# Patient Record
Sex: Female | Born: 1970 | Race: White | Hispanic: No | Marital: Married | Smoking: Former smoker
Health system: Southern US, Community
[De-identification: ages and names within clinical notes are randomized; demographics above are authoritative.]

## PROBLEM LIST (undated history)

## (undated) DIAGNOSIS — R51 Headache: Secondary | ICD-10-CM

## (undated) DIAGNOSIS — D1720 Benign lipomatous neoplasm of skin and subcutaneous tissue of unspecified limb: Secondary | ICD-10-CM

## (undated) DIAGNOSIS — R05 Cough: Secondary | ICD-10-CM

## (undated) HISTORY — PX: NO PAST SURGERIES: SHX2092

---

## 2012-08-23 ENCOUNTER — Encounter: Payer: Self-pay | Admitting: Family Medicine

## 2012-09-06 ENCOUNTER — Other Ambulatory Visit: Payer: Self-pay | Admitting: Family Medicine

## 2012-11-07 ENCOUNTER — Encounter: Payer: Self-pay | Admitting: Family Medicine

## 2012-11-29 ENCOUNTER — Ambulatory Visit (INDEPENDENT_AMBULATORY_CARE_PROVIDER_SITE_OTHER): Payer: 59 | Admitting: Family Medicine

## 2012-11-29 ENCOUNTER — Encounter: Payer: Self-pay | Admitting: Family Medicine

## 2012-11-29 VITALS — BP 100/60 | HR 78 | Temp 98.3°F | Resp 16 | Ht 59.0 in | Wt 135.0 lb

## 2012-11-29 DIAGNOSIS — Z0000 Encounter for general adult medical examination without abnormal findings: Secondary | ICD-10-CM

## 2012-11-29 DIAGNOSIS — Z87891 Personal history of nicotine dependence: Secondary | ICD-10-CM | POA: Insufficient documentation

## 2012-11-29 NOTE — Progress Notes (Signed)
  Subjective:    Patient ID: Jeanette Herring, female    DOB: 09-04-1970, 42 y.o.   MRN: [id number]  HPI Patient is here for complete physical exam. She has no medical concerns.  She has no significant past medical history. Past Medical History  Diagnosis Date  . Former smoker    No current outpatient prescriptions on file prior to visit.   No current facility-administered medications on file prior to visit.   No Known Allergies History   Social History  . Marital Status: Married    Spouse Name: N/A    Number of Children: N/A  . Years of Education: N/A   Occupational History  . Not on file.   Social History Main Topics  . Smoking status: Former Games developer  . Smokeless tobacco: Never Used  . Alcohol Use: Yes     Comment: ocasionally  . Drug Use: No  . Sexually Active: Yes     Comment: Married with no kids, 2 step kids, works at [employer]   Other Topics Concern  . Not on file   Social History Narrative  . No narrative on file   No family history on file. Mother, father, sister, 2 step sisters are alive and well. She has a maternal grandfather with colon cancer and maternal grandmother with a CVA.   Review of Systems  All other systems reviewed and are negative.       Objective:   Physical Exam  Vitals reviewed. Constitutional: She is oriented to person, place, and time. She appears well-developed and well-nourished. No distress.  HENT:  Head: Normocephalic and atraumatic.  Right Ear: External ear normal.  Left Ear: External ear normal.  Nose: Nose normal.  Mouth/Throat: Oropharynx is clear and moist. No oropharyngeal exudate.  Eyes: Conjunctivae and EOM are normal. Pupils are equal, round, and reactive to light. Right eye exhibits no discharge. Left eye exhibits no discharge. No scleral icterus.  Neck: Normal range of motion. Neck supple. No JVD present. No tracheal deviation present. No thyromegaly present.  Cardiovascular: Normal rate, regular rhythm, normal  heart sounds and intact distal pulses.  Exam reveals no gallop and no friction rub.   No murmur heard. Pulmonary/Chest: Effort normal and breath sounds normal. No stridor. No respiratory distress. She has no wheezes. She has no rales. She exhibits no tenderness.  Abdominal: Soft. Bowel sounds are normal. She exhibits no distension and no mass. There is no tenderness. There is no rebound and no guarding.  Genitourinary: Vagina normal and uterus normal. No vaginal discharge found.  Musculoskeletal: Normal range of motion. She exhibits no edema and no tenderness.  Lymphadenopathy:    She has no cervical adenopathy.  Neurological: She is alert and oriented to person, place, and time. She has normal reflexes. She displays normal reflexes. No cranial nerve deficit. She exhibits normal muscle tone. Coordination normal.  Skin: Skin is warm. No rash noted. She is not diaphoretic. No erythema. No pallor.  Psychiatric: She has a normal mood and affect. Her behavior is normal. Judgment and thought content normal.   breast exam is normal. No cervical motion tenderness. No adnexal tenderness.        Assessment & Plan:  1. Routine general medical examination at a health care facility Pap smear sent.  Recommended mammogram next year. Return fasting for CBC, CMP, fasting lipid panel, and TSH. Recommended 1000 mg of calcium per day and 800 international units of vitamin D per day. - Pap IG (Image Guided) Loney Loh

## 2012-12-02 LAB — PAP IG (IMAGE GUIDED)

## 2012-12-09 ENCOUNTER — Ambulatory Visit (INDEPENDENT_AMBULATORY_CARE_PROVIDER_SITE_OTHER): Payer: 59 | Admitting: General Surgery

## 2012-12-09 ENCOUNTER — Encounter (INDEPENDENT_AMBULATORY_CARE_PROVIDER_SITE_OTHER): Payer: Self-pay | Admitting: General Surgery

## 2012-12-09 VITALS — BP 116/65 | HR 64 | Temp 98.6°F | Resp 14 | Ht 59.0 in | Wt 133.2 lb

## 2012-12-09 DIAGNOSIS — R2230 Localized swelling, mass and lump, unspecified upper limb: Secondary | ICD-10-CM

## 2012-12-09 DIAGNOSIS — M259 Joint disorder, unspecified: Secondary | ICD-10-CM

## 2012-12-09 NOTE — Progress Notes (Signed)
Patient ID: Jeanette Herring, female   DOB: October 09, 1970, 42 y.o.   MRN: [id number]  Chief Complaint  Patient presents with  . Mass    HPI Jeanette Herring is a 42 y.o. female. Referred by Dr Yetta Barre HPI [phone] otherwise healthy who presents with a couple year history of enlarging right shoulder mass that is now tender at times. This has no history of infection or drainage.  She comes in today to discuss possible excision  Past Medical History  Diagnosis Date  . Former smoker     History reviewed. No pertinent past surgical history.  History reviewed. No pertinent family history.  Social History History  Substance Use Topics  . Smoking status: Former Games developer  . Smokeless tobacco: Never Used  . Alcohol Use: Yes     Comment: ocasionally    No Known Allergies  No current outpatient prescriptions on file.   No current facility-administered medications for this visit.    Review of Systems Review of Systems  Constitutional: Negative for fever, chills and unexpected weight change.  HENT: Positive for congestion. Negative for hearing loss, sore throat, trouble swallowing and voice change.   Eyes: Negative for visual disturbance.  Respiratory: Negative for cough and wheezing.   Cardiovascular: Negative for chest pain, palpitations and leg swelling.  Gastrointestinal: Negative for nausea, vomiting, abdominal pain, diarrhea, constipation, blood in stool, abdominal distention and anal bleeding.  Genitourinary: Negative for hematuria, vaginal bleeding and difficulty urinating.  Musculoskeletal: Negative for arthralgias.  Skin: Negative for rash and wound.  Neurological: Negative for seizures, syncope and headaches.  Hematological: Negative for adenopathy. Does not bruise/bleed easily.  Psychiatric/Behavioral: Negative for confusion.    Blood pressure 116/65, pulse 64, temperature 98.6 F (37 C), temperature source Temporal, resp. rate 14, height 4\' 11"  (1.499 m), weight 133 lb 3.2 oz  (60.419 kg).  Physical Exam Physical Exam  Vitals reviewed. Constitutional: She appears well-developed and well-nourished.  Cardiovascular: Normal rate, regular rhythm and normal heart sounds.   Pulmonary/Chest: Effort normal and breath sounds normal. She has no wheezes. She has no rales.  Musculoskeletal:       Arms:   Data Reviewed Notes from [facility] dermatology  Assessment    Right shoulder mass    Plan    This is likely lipoma. We discussed observation vs excision. This has enlarged and she would like it removed.  We discussed excision with possible risks including to but not limited to bleeding, infection, seroma.         Priscilla Kirstein 12/09/2012, 11:11 PM

## 2012-12-20 ENCOUNTER — Telehealth (INDEPENDENT_AMBULATORY_CARE_PROVIDER_SITE_OTHER): Payer: Self-pay | Admitting: General Surgery

## 2012-12-20 NOTE — Telephone Encounter (Signed)
Pt made aware of her financial obligation/ orders good for 90 days/ will call to schedule

## 2012-12-31 ENCOUNTER — Other Ambulatory Visit: Payer: 59

## 2012-12-31 DIAGNOSIS — Z0000 Encounter for general adult medical examination without abnormal findings: Secondary | ICD-10-CM

## 2012-12-31 LAB — CBC WITH DIFFERENTIAL/PLATELET
Eosinophils Absolute: 0.1 10*3/uL (ref 0.0–0.7)
Eosinophils Relative: 2 % (ref 0–5)
Hemoglobin: 13.3 g/dL (ref 12.0–15.0)
Lymphs Abs: 1.6 10*3/uL (ref 0.7–4.0)
MCH: 29.5 pg (ref 26.0–34.0)
MCV: 88.9 fL (ref 78.0–100.0)
Monocytes Absolute: 0.6 10*3/uL (ref 0.1–1.0)
Monocytes Relative: 8 % (ref 3–12)
Platelets: 320 10*3/uL (ref 150–400)
RBC: 4.51 MIL/uL (ref 3.87–5.11)

## 2012-12-31 LAB — COMPLETE METABOLIC PANEL WITH GFR
AST: 13 U/L (ref 0–37)
Alkaline Phosphatase: 66 U/L (ref 39–117)
BUN: 15 mg/dL (ref 6–23)
Calcium: 9.4 mg/dL (ref 8.4–10.5)
Chloride: 109 mEq/L (ref 96–112)
Creat: 0.81 mg/dL (ref 0.50–1.10)

## 2012-12-31 LAB — LIPID PANEL
Cholesterol: 165 mg/dL (ref 0–200)
Triglycerides: 63 mg/dL (ref <150)
VLDL: 13 mg/dL (ref 0–40)

## 2013-06-11 ENCOUNTER — Ambulatory Visit (INDEPENDENT_AMBULATORY_CARE_PROVIDER_SITE_OTHER): Payer: 59 | Admitting: General Surgery

## 2013-06-11 ENCOUNTER — Encounter (INDEPENDENT_AMBULATORY_CARE_PROVIDER_SITE_OTHER): Payer: Self-pay | Admitting: General Surgery

## 2013-06-11 VITALS — BP 102/62 | HR 70 | Resp 16 | Ht 59.0 in | Wt 135.0 lb

## 2013-06-11 DIAGNOSIS — D1720 Benign lipomatous neoplasm of skin and subcutaneous tissue of unspecified limb: Secondary | ICD-10-CM

## 2013-06-11 DIAGNOSIS — D1739 Benign lipomatous neoplasm of skin and subcutaneous tissue of other sites: Secondary | ICD-10-CM

## 2013-06-11 NOTE — Progress Notes (Signed)
Patient ID: Jeanette Herring, female   DOB: 21-May-1970, 43 y.o.   MRN: [id number]  Chief Complaint  Patient presents with  . Other    Eval lipoma rt shoulder    HPI Jeanette Herring is a 43 y.o. female.   HPI 37 yof otherwise healthy who presents with a couple year history of enlarging right shoulder mass that is now tender at times. This has no history of infection or drainage. I saw her at the end of last year she was unable to do this at that point. This is still getting a little bit larger at this time but has no other complaints as I last saw her.  Past Medical History  Diagnosis Date  . Former smoker     History reviewed. No pertinent past surgical history.  History reviewed. No pertinent family history.  Social History History  Substance Use Topics  . Smoking status: Former Research scientist (life sciences)  . Smokeless tobacco: Never Used  . Alcohol Use: Yes     Comment: ocasionally    No Known Allergies  No current outpatient prescriptions on file.   No current facility-administered medications for this visit.    Review of Systems Review of Systems  Constitutional: Negative for fever, chills and unexpected weight change.  HENT: Negative for congestion, hearing loss, sore throat, trouble swallowing and voice change.   Eyes: Negative for visual disturbance.  Respiratory: Negative for cough and wheezing.   Cardiovascular: Negative for chest pain, palpitations and leg swelling.  Gastrointestinal: Negative for nausea, vomiting, abdominal pain, diarrhea, constipation, blood in stool, abdominal distention and anal bleeding.  Genitourinary: Negative for hematuria, vaginal bleeding and difficulty urinating.  Musculoskeletal: Negative for arthralgias.  Skin: Negative for rash and wound.  Neurological: Negative for seizures, syncope and headaches.  Hematological: Negative for adenopathy. Does not bruise/bleed easily.  Psychiatric/Behavioral: Negative for confusion.    Blood pressure 102/62, pulse  70, resp. rate 16, height 4\' 11"  (1.499 m), weight 135 lb (61.236 kg).  Physical Exam Physical Exam  Vitals reviewed. Constitutional: She appears well-developed.  Cardiovascular: Normal rate, regular rhythm and normal heart sounds.   Pulmonary/Chest: Effort normal and breath sounds normal. She has no wheezes. She has no rales.  Musculoskeletal:       Arms: Lymphadenopathy:    She has no cervical adenopathy.      Assessment    Right shoulder lipoma    Plan    She is ready to have this removed now. We discussed excision of this likely lipoma under anesthesia. We discussed the risks of hematoma, seroma, as well as a recurrence. I told her that if she goes back to work quickly then we would need to put sutures on the outside and she understands this.        Koi Yarbro 06/11/2013, 9:55 AM

## 2013-06-23 ENCOUNTER — Encounter: Payer: Self-pay | Admitting: Family Medicine

## 2013-06-23 ENCOUNTER — Ambulatory Visit (INDEPENDENT_AMBULATORY_CARE_PROVIDER_SITE_OTHER): Payer: 59 | Admitting: Family Medicine

## 2013-06-23 VITALS — BP 110/68 | HR 76 | Temp 97.4°F | Resp 12 | Ht 59.0 in | Wt 137.0 lb

## 2013-06-23 DIAGNOSIS — J019 Acute sinusitis, unspecified: Secondary | ICD-10-CM

## 2013-06-23 MED ORDER — AMOXICILLIN-POT CLAVULANATE 875-125 MG PO TABS
1.0000 | ORAL_TABLET | Freq: Two times a day (BID) | ORAL | Status: DC
Start: 2013-06-23 — End: 2013-07-14

## 2013-06-23 MED ORDER — FLUCONAZOLE 150 MG PO TABS: 150.0000 mg | ORAL_TABLET | Freq: Once | ORAL | Status: DC

## 2013-06-23 NOTE — Progress Notes (Signed)
   Subjective:    Patient ID: Jeanette Herring, female    DOB: 05-04-1970, 43 y.o.   MRN: [id number]  HPI Patient presents with 3 days of right maxillary sinus pain, left frontal sinus pain, pressure, head congestion, rhinorrhea, and nonproductive cough. She denies any fever. She denies any pain in her teeth. She denies any purulent nasal discharge. Past Medical History  Diagnosis Date  . Former smoker    No current outpatient prescriptions on file prior to visit.   No current facility-administered medications on file prior to visit.   No Known Allergies History   Social History  . Marital Status: Married    Spouse Name: N/A    Number of Children: N/A  . Years of Education: N/A   Occupational History  . Not on file.   Social History Main Topics  . Smoking status: Former Research scientist (life sciences)  . Smokeless tobacco: Never Used  . Alcohol Use: Yes     Comment: ocasionally  . Drug Use: No  . Sexual Activity: Yes     Comment: Married with no kids, 2 step kids, works at [employer].   Other Topics Concern  . Not on file   Social History Narrative  . No narrative on file      Review of Systems  All other systems reviewed and are negative.       Objective:   Physical Exam  Vitals reviewed. Constitutional: She appears well-developed and well-nourished. No distress.  HENT:  Head: Normocephalic and atraumatic.  Right Ear: Tympanic membrane, external ear and ear canal normal.  Left Ear: Tympanic membrane, external ear and ear canal normal.  Nose: Mucosal edema and rhinorrhea present. Right sinus exhibits no maxillary sinus tenderness and no frontal sinus tenderness. Left sinus exhibits no maxillary sinus tenderness and no frontal sinus tenderness.  Mouth/Throat: Oropharynx is clear and moist. No oropharyngeal exudate.  Eyes: Conjunctivae are normal.  Neck: Neck supple. No thyromegaly present.  Cardiovascular: Normal rate, regular rhythm and normal heart sounds.   No murmur  heard. Pulmonary/Chest: Effort normal and breath sounds normal. No respiratory distress. She has no wheezes. She has no rales.  Lymphadenopathy:    She has no cervical adenopathy.  Skin: She is not diaphoretic.          Assessment & Plan:  1. Acute rhinosinusitis I explained to the patient that she has a viral rhinosinusitis. I explained that 90% get better within 10 days on their own. I recommended that she use Sudafed 60 mg every 6 hours as needed for congestion and nasal saline 4 times a day. If symptoms persist greater than one week or she develops fever/severe sinus pain/dental pain, I gave her a prescription for Augmentin 875 mg by mouth twice a day for 10 days. Also gave her a prescription for Diflucan in case she gets yeast infection after she takes the antibiotic. She agrees to wait at least one week unless symptoms change - amoxicillin-clavulanate (AUGMENTIN) 875-125 MG per tablet; Take 1 tablet by mouth 2 (two) times daily.  Dispense: 20 tablet; Refill: 0 - fluconazole (DIFLUCAN) 150 MG tablet; Take 1 tablet (150 mg total) by mouth once.  Dispense: 1 tablet; Refill: 0

## 2013-06-29 DIAGNOSIS — D1720 Benign lipomatous neoplasm of skin and subcutaneous tissue of unspecified limb: Secondary | ICD-10-CM

## 2013-06-29 HISTORY — DX: Benign lipomatous neoplasm of skin and subcutaneous tissue of unspecified limb: D17.20

## 2013-07-14 ENCOUNTER — Encounter (HOSPITAL_BASED_OUTPATIENT_CLINIC_OR_DEPARTMENT_OTHER): Payer: Self-pay | Admitting: *Deleted

## 2013-07-14 DIAGNOSIS — R059 Cough, unspecified: Secondary | ICD-10-CM

## 2013-07-14 HISTORY — DX: Cough, unspecified: R05.9

## 2013-07-16 ENCOUNTER — Other Ambulatory Visit (INDEPENDENT_AMBULATORY_CARE_PROVIDER_SITE_OTHER): Payer: Self-pay | Admitting: General Surgery

## 2013-07-17 ENCOUNTER — Ambulatory Visit (HOSPITAL_BASED_OUTPATIENT_CLINIC_OR_DEPARTMENT_OTHER)
Admission: RE | Admit: 2013-07-17 | Discharge: 2013-07-17 | Disposition: A | Discharge status: Home / Self Care | Payer: 59 | Source: Ambulatory Visit | Attending: General Surgery | Admitting: General Surgery

## 2013-07-17 ENCOUNTER — Encounter (HOSPITAL_BASED_OUTPATIENT_CLINIC_OR_DEPARTMENT_OTHER): Payer: Self-pay | Admitting: *Deleted

## 2013-07-17 ENCOUNTER — Encounter (INDEPENDENT_AMBULATORY_CARE_PROVIDER_SITE_OTHER): Payer: Self-pay

## 2013-07-17 ENCOUNTER — Encounter (HOSPITAL_BASED_OUTPATIENT_CLINIC_OR_DEPARTMENT_OTHER): Payer: 59 | Admitting: Anesthesiology

## 2013-07-17 ENCOUNTER — Encounter (INDEPENDENT_AMBULATORY_CARE_PROVIDER_SITE_OTHER): Payer: Self-pay | Admitting: General Surgery

## 2013-07-17 ENCOUNTER — Encounter (HOSPITAL_BASED_OUTPATIENT_CLINIC_OR_DEPARTMENT_OTHER)
Admission: RE | Disposition: A | Discharge status: Home / Self Care | Payer: Self-pay | Source: Ambulatory Visit | Attending: General Surgery

## 2013-07-17 ENCOUNTER — Ambulatory Visit (HOSPITAL_BASED_OUTPATIENT_CLINIC_OR_DEPARTMENT_OTHER): Payer: 59 | Admitting: Anesthesiology

## 2013-07-17 DIAGNOSIS — D1739 Benign lipomatous neoplasm of skin and subcutaneous tissue of other sites: Secondary | ICD-10-CM

## 2013-07-17 DIAGNOSIS — Z87891 Personal history of nicotine dependence: Secondary | ICD-10-CM | POA: Insufficient documentation

## 2013-07-17 HISTORY — PX: LIPOMA EXCISION: SHX5283

## 2013-07-17 HISTORY — DX: Cough: R05

## 2013-07-17 HISTORY — DX: Benign lipomatous neoplasm of skin and subcutaneous tissue of unspecified limb: D17.20

## 2013-07-17 HISTORY — DX: Headache: R51

## 2013-07-17 LAB — POCT HEMOGLOBIN-HEMACUE: Hemoglobin: 14.9 g/dL (ref 12.0–15.0)

## 2013-07-17 SURGERY — EXCISION LIPOMA
Anesthesia: General | Laterality: Right

## 2013-07-17 MED ORDER — METOCLOPRAMIDE HCL 5 MG/ML IJ SOLN: 10.0000 mg | Freq: Once | INTRAMUSCULAR | Status: DC | PRN

## 2013-07-17 MED ORDER — MIDAZOLAM HCL 5 MG/5ML IJ SOLN
INTRAMUSCULAR | Status: DC | PRN
  Administered 2013-07-17: 1 mg via INTRAVENOUS

## 2013-07-17 MED ORDER — CEFAZOLIN SODIUM-DEXTROSE 2-3 GM-% IV SOLR: 2.0000 g | INTRAVENOUS | Status: DC

## 2013-07-17 MED ORDER — MIDAZOLAM HCL 2 MG/2ML IJ SOLN
INTRAMUSCULAR | Status: AC
  Filled 2013-07-17: qty 2

## 2013-07-17 MED ORDER — DEXAMETHASONE SODIUM PHOSPHATE 4 MG/ML IJ SOLN
INTRAMUSCULAR | Status: DC | PRN
  Administered 2013-07-17: 8 mg via INTRAVENOUS

## 2013-07-17 MED ORDER — FENTANYL CITRATE 0.05 MG/ML IJ SOLN
INTRAMUSCULAR | Status: AC
  Filled 2013-07-17: qty 6

## 2013-07-17 MED ORDER — BUPIVACAINE HCL (PF) 0.25 % IJ SOLN
INTRAMUSCULAR | Status: DC | PRN
  Administered 2013-07-17: 17 mL

## 2013-07-17 MED ORDER — FENTANYL CITRATE 0.05 MG/ML IJ SOLN
INTRAMUSCULAR | Status: DC | PRN
Start: 2013-07-17 — End: 2013-07-17
  Administered 2013-07-17 (×2): 50 ug via INTRAVENOUS

## 2013-07-17 MED ORDER — MIDAZOLAM HCL 2 MG/ML PO SYRP: 12.0000 mg | ORAL_SOLUTION | Freq: Once | ORAL | Status: DC | PRN

## 2013-07-17 MED ORDER — ONDANSETRON HCL 4 MG/2ML IJ SOLN
INTRAMUSCULAR | Status: DC | PRN
  Administered 2013-07-17: 4 mg via INTRAVENOUS

## 2013-07-17 MED ORDER — OXYCODONE HCL 5 MG/5ML PO SOLN: 5.0000 mg | Freq: Once | ORAL | Status: DC | PRN

## 2013-07-17 MED ORDER — MIDAZOLAM HCL 2 MG/2ML IJ SOLN: 1.0000 mg | INTRAMUSCULAR | Status: DC | PRN

## 2013-07-17 MED ORDER — CEFAZOLIN SODIUM-DEXTROSE 2-3 GM-% IV SOLR
INTRAVENOUS | Status: AC
  Filled 2013-07-17: qty 50

## 2013-07-17 MED ORDER — PROPOFOL 10 MG/ML IV BOLUS
INTRAVENOUS | Status: DC | PRN
  Administered 2013-07-17: 160 mg via INTRAVENOUS

## 2013-07-17 MED ORDER — OXYCODONE HCL 5 MG PO TABS: 5.0000 mg | ORAL_TABLET | Freq: Once | ORAL | Status: DC | PRN

## 2013-07-17 MED ORDER — LIDOCAINE HCL (CARDIAC) 20 MG/ML IV SOLN
INTRAVENOUS | Status: DC | PRN
Start: 2013-07-17 — End: 2013-07-17
  Administered 2013-07-17: 40 mg via INTRAVENOUS

## 2013-07-17 MED ORDER — HYDROMORPHONE HCL PF 1 MG/ML IJ SOLN: 0.2500 mg | INTRAMUSCULAR | Status: DC | PRN

## 2013-07-17 MED ORDER — LACTATED RINGERS IV SOLN
INTRAVENOUS | Status: DC
  Administered 2013-07-17: 07:00:00 via INTRAVENOUS

## 2013-07-17 MED ORDER — PROPOFOL 10 MG/ML IV EMUL
INTRAVENOUS | Status: AC
  Filled 2013-07-17: qty 50

## 2013-07-17 MED ORDER — BACITRACIN ZINC 500 UNIT/GM EX OINT
TOPICAL_OINTMENT | CUTANEOUS | Status: AC
  Filled 2013-07-17: qty 0.9

## 2013-07-17 MED ORDER — BUPIVACAINE HCL (PF) 0.25 % IJ SOLN
INTRAMUSCULAR | Status: AC
  Filled 2013-07-17: qty 30

## 2013-07-17 MED ORDER — HYDROCODONE-ACETAMINOPHEN 10-325 MG PO TABS: 1.0000 | ORAL_TABLET | Freq: Four times a day (QID) | ORAL | Status: DC | PRN

## 2013-07-17 MED ORDER — FENTANYL CITRATE 0.05 MG/ML IJ SOLN: 50.0000 ug | INTRAMUSCULAR | Status: DC | PRN

## 2013-07-17 SURGICAL SUPPLY — 46 items
BLADE SURG 15 STRL LF DISP TIS (BLADE) ×1 IMPLANT
BLADE SURG 15 STRL SS (BLADE) ×2
BLADE SURG ROTATE 9660 (MISCELLANEOUS) IMPLANT
CANISTER SUCT 1200ML W/VALVE (MISCELLANEOUS) IMPLANT
CHLORAPREP W/TINT 26ML (MISCELLANEOUS) ×3 IMPLANT
COVER MAYO STAND STRL (DRAPES) ×3 IMPLANT
COVER TABLE BACK 60X90 (DRAPES) ×3 IMPLANT
DECANTER SPIKE VIAL GLASS SM (MISCELLANEOUS) ×3 IMPLANT
DERMABOND ADVANCED (GAUZE/BANDAGES/DRESSINGS) ×2
DERMABOND ADVANCED .7 DNX12 (GAUZE/BANDAGES/DRESSINGS) ×1 IMPLANT
DRAPE PED LAPAROTOMY (DRAPES) ×3 IMPLANT
DRSG TEGADERM 2-3/8X2-3/4 SM (GAUZE/BANDAGES/DRESSINGS) ×3 IMPLANT
DRSG TEGADERM 4X4.75 (GAUZE/BANDAGES/DRESSINGS) IMPLANT
ELECT COATED BLADE 2.86 ST (ELECTRODE) ×3 IMPLANT
ELECT REM PT RETURN 9FT ADLT (ELECTROSURGICAL) ×3
ELECTRODE REM PT RTRN 9FT ADLT (ELECTROSURGICAL) ×1 IMPLANT
GAUZE PACKING IODOFORM 1/4X15 (GAUZE/BANDAGES/DRESSINGS) IMPLANT
GLOVE BIO SURGEON STRL SZ7 (GLOVE) ×3 IMPLANT
GLOVE BIOGEL M 7.0 STRL (GLOVE) ×3 IMPLANT
GLOVE BIOGEL PI IND STRL 7.5 (GLOVE) ×2 IMPLANT
GLOVE BIOGEL PI INDICATOR 7.5 (GLOVE) ×4
GLOVE EXAM NITRILE MD LF STRL (GLOVE) ×3 IMPLANT
GOWN STRL REUS W/ TWL LRG LVL3 (GOWN DISPOSABLE) ×3 IMPLANT
GOWN STRL REUS W/TWL LRG LVL3 (GOWN DISPOSABLE) ×6
NEEDLE HYPO 25X1 1.5 SAFETY (NEEDLE) ×3 IMPLANT
NS IRRIG 1000ML POUR BTL (IV SOLUTION) IMPLANT
PACK BASIN DAY SURGERY FS (CUSTOM PROCEDURE TRAY) ×3 IMPLANT
PENCIL BUTTON HOLSTER BLD 10FT (ELECTRODE) ×3 IMPLANT
SPONGE GAUZE 4X4 12PLY STER LF (GAUZE/BANDAGES/DRESSINGS) IMPLANT
STAPLER VISISTAT 35W (STAPLE) IMPLANT
SUT ETHILON 2 0 FS 18 (SUTURE) ×3 IMPLANT
SUT MNCRL AB 4-0 PS2 18 (SUTURE) ×3 IMPLANT
SUT SILK 2 0 SH (SUTURE) IMPLANT
SUT VIC AB 2-0 SH 27 (SUTURE)
SUT VIC AB 2-0 SH 27XBRD (SUTURE) IMPLANT
SUT VICRYL 3-0 CR8 SH (SUTURE) IMPLANT
SUT VICRYL 4-0 PS2 18IN ABS (SUTURE) IMPLANT
SWAB COLLECTION DEVICE MRSA (MISCELLANEOUS) IMPLANT
SYR CONTROL 10ML LL (SYRINGE) ×3 IMPLANT
TOWEL OR 17X24 6PK STRL BLUE (TOWEL DISPOSABLE) ×3 IMPLANT
TOWEL OR NON WOVEN STRL DISP B (DISPOSABLE) ×3 IMPLANT
TUBE ANAEROBIC SPECIMEN COL (MISCELLANEOUS) IMPLANT
TUBE CONNECTING 20'X1/4 (TUBING)
TUBE CONNECTING 20X1/4 (TUBING) IMPLANT
UNDERPAD 30X30 INCONTINENT (UNDERPADS AND DIAPERS) IMPLANT
YANKAUER SUCT BULB TIP NO VENT (SUCTIONS) IMPLANT

## 2013-07-17 NOTE — Transfer of Care (Signed)
Immediate Anesthesia Transfer of Care Note  Patient: Jeanette Herring  Procedure(s) Performed: Procedure(s): EXCISION RIGHT SHOULDER LIPOMA (Right)  Patient Location: PACU  Anesthesia Type:General  Level of Consciousness: awake, alert  and patient cooperative  Airway & Oxygen Therapy: Patient Spontanous Breathing and Patient connected to face mask oxygen  Post-op Assessment: Report given to PACU RN, Post -op Vital signs reviewed and stable and Patient moving all extremities  Post vital signs: Reviewed and stable  Complications: No apparent anesthesia complications

## 2013-07-17 NOTE — Anesthesia Preprocedure Evaluation (Addendum)
Anesthesia Evaluation  Patient identified by MRN, date of birth, ID band Patient awake    Reviewed: Allergy & Precautions, H&P , NPO status , Patient's Chart, lab work & pertinent test results, reviewed documented beta blocker date and time   Airway Mallampati: II TM Distance: >3 FB Neck ROM: full    Dental   Pulmonary neg pulmonary ROS, former smoker,  breath sounds clear to auscultation        Cardiovascular negative cardio ROS  Rhythm:regular     Neuro/Psych  Headaches, negative psych ROS   GI/Hepatic negative GI ROS, Neg liver ROS,   Endo/Other  negative endocrine ROS  Renal/GU negative Renal ROS  negative genitourinary   Musculoskeletal   Abdominal   Peds  Hematology negative hematology ROS (+)   Anesthesia Other Findings See surgeon's H&P   Reproductive/Obstetrics negative OB ROS                         Anesthesia Physical Anesthesia Plan  ASA: I  Anesthesia Plan: General and MAC   Post-op Pain Management:    Induction: Intravenous  Airway Management Planned: LMA and Oral ETT  Additional Equipment:   Intra-op Plan:   Post-operative Plan:   Informed Consent: I have reviewed the patients History and Physical, chart, labs and discussed the procedure including the risks, benefits and alternatives for the proposed anesthesia with the patient or authorized representative who has indicated his/her understanding and acceptance.   Dental Advisory Given  Plan Discussed with: CRNA and Surgeon  Anesthesia Plan Comments:       Anesthesia Quick Evaluation

## 2013-07-17 NOTE — Anesthesia Postprocedure Evaluation (Signed)
Anesthesia Post Note  Patient: Jeanette Herring  Procedure(s) Performed: Procedure(s) (LRB): EXCISION RIGHT SHOULDER LIPOMA (Right)  Anesthesia type: General  Patient location: PACU  Post pain: Pain level controlled  Post assessment: Patient's Cardiovascular Status Stable  Last Vitals:  Filed Vitals:   07/17/13 0830  BP: 103/70  Pulse: 82  Temp:   Resp: 17    Post vital signs: Reviewed and stable  Level of consciousness: alert  Complications: No apparent anesthesia complications

## 2013-07-17 NOTE — H&P (Signed)
  67 yof otherwise healthy who presents with a couple year history of enlarging right shoulder mass that is now tender at times. This has no history of infection or drainage. I saw her at the end of last year she was unable to do this at that point. This is still getting a little bit larger at this time but has no other complaints as I last saw her.   Past Medical History   Diagnosis  Date   .  Former smoker    History reviewed. No pertinent past surgical history.  History reviewed. No pertinent family history.  Social History  History   Substance Use Topics   .  Smoking status:  Former Research scientist (life sciences)   .  Smokeless tobacco:  Never Used   .  Alcohol Use:  Yes      Comment: ocasionally   No Known Allergies  No current outpatient prescriptions on file.    No current facility-administered medications for this visit.    Review of Systems  Review of Systems  Constitutional: Negative for fever, chills and unexpected weight change.  HENT: Negative for congestion, hearing loss, sore throat, trouble swallowing and voice change.  Eyes: Negative for visual disturbance.  Respiratory: Negative for cough and wheezing.  Cardiovascular: Negative for chest pain, palpitations and leg swelling.  Gastrointestinal: Negative for nausea, vomiting, abdominal pain, diarrhea, constipation, blood in stool, abdominal distention and anal bleeding.    Physical Exam  Physical Exam  Vitals reviewed.  Constitutional: She appears well-developed.  Cardiovascular: Normal rate, regular rhythm and normal heart sounds.  Pulmonary/Chest: Effort normal and breath sounds normal. She has no wheezes. She has no rales.  Musculoskeletal:  Arms:  Assessment  Right shoulder lipoma   Plan  She is ready to have this removed now. We discussed excision of this likely lipoma under anesthesia. We discussed the risks of hematoma, seroma, as well as a recurrence. I told her that if she goes back to work quickly then we would need to put  sutures on the outside and she understands this.

## 2013-07-17 NOTE — Op Note (Signed)
Preoperative diagnosis: Right shoulder lipoma  postoperative diagnoses: Same as above Procedure: Excision of 10 x 10 cm right shoulder lipoma, subcutaneous Surgeon: Dr. Serita Grammes Anesthesia: Gen. Estimated blood loss: Minimal Drains: None Complications: None Specimens: Right shoulder lipoma to pathology Disposition to recovery stable Sponge count correct at completion  Indications: This is a 43 year old female with an enlarging right shoulder mass that now desires excision. She and I discussed excision as well as the risks, benefits, postoperative restrictions.  Procedure: After informed consent was obtained the patient was taken to the operating room. She was given cefazolin. Sequential compression devices were on her legs. She was then placed under general anesthesia with an LMA. She was positioned and padded appropriately. She was then prepped and draped in the standard sterile surgical fashion. A surgical timeout was then performed.  I infiltrated quarter percent Marcaine throughout the region of the right shoulder lipoma. This was about 10 x 10 cm and was in a subcutaneous position. I then made a 3 cm incision overlying this. I then removed the lipoma in its entirety through this incision. This was then passed off the table as a specimen. I then obtained hemostasis. I closed this in several layers with 3-0 Vicryl and then used external 2-0 nylon sutures. I used more Marcaine at completion. Bacitracin and a sterile dressing were placed. She tolerated this well and was transferred to recovery stable.

## 2013-07-17 NOTE — Discharge Instructions (Signed)
[facility] Phone Number [phone]  POST OP INSTRUCTIONS  Always review your discharge instruction sheet given to you by the facility where your surgery was performed.  IF YOU HAVE DISABILITY OR FAMILY LEAVE FORMS, YOU MUST BRING THEM TO THE OFFICE FOR PROCESSING.  DO NOT GIVE THEM TO YOUR DOCTOR.  1. A prescription for pain medication may be given to you upon discharge.  Take your pain medication as prescribed, if needed.  If narcotic pain medicine is not needed, then you may take acetaminophen (Tylenol), naprosyn (Alleve) or ibuprofen (Advil) as needed. 2. Take your usually prescribed medications unless otherwise directed 3. If you need a refill on your pain medication, please contact your pharmacy.  They will contact our office to request authorization.  Prescriptions will not be filled after 5pm or on week-ends. 4. You should eat very light the first 24 hours after surgery, such as soup, crackers, pudding, etc.  Resume your normal diet the day after surgery. 5. Most patients will experience some swelling and bruising. Swelling and bruising can take several days to resolve.  6. It is common to experience some constipation if taking pain medication after surgery.  Increasing fluid intake and taking a stool softener will usually help or prevent this problem from occurring.  A mild laxative (Milk of Magnesia or Miralax) should be taken according to package directions if there are no bowel movements after 48 hours. 7. Unless discharge instructions indicate otherwise, you may remove your bandages 48 hours after surgery and you may shower at that time.  You may have steri-strips (small skin tapes) in place directly over the incision.  These strips should be left on the skin for 7-10 days and will come off on their own.  If your surgeon used skin glue on the incision, you may shower in 24 hours.  The glue will flake off over the next 2-3 weeks.  Any sutures or staples will be  removed at the office during your follow-up visit. 8. ACTIVITIES:  You may resume regular daily activities (gradually increasing) beginning the next day.  You may have sexual intercourse when it is comfortable. a. You may drive when you no longer are taking prescription pain medication, you can comfortably wear a seatbelt, and you can safely maneuver your car and apply brakes. b. RETURN TO WORK:  _______2 days_______________________________________________________________________________ 9. You should see your doctor in the office for a follow-up appointment approximately two weeks after your surgery.  Your doctors nurse will typically make your follow-up appointment when she calls you with your pathology report.  Expect your pathology report 3-4 business days after your surgery.  You may call to check if you do not hear from [place] after three days. 10. OTHER INSTRUCTIONS: _______________________________________________________________________________________________ _____________________________________________________________________________________________________________________________________ _____________________________________________________________________________________________________________________________________ _____________________________________________________________________________________________________________________________________  WHEN TO CALL DR WAKEFIELD: 1. Fever over 101.0 2. Nausea and/or vomiting. 3. Extreme swelling or bruising. 4. Continued bleeding from incision. 5. Increased pain, redness, or drainage from the incision.  The clinic staff is available to answer your questions during regular business hours.  Please dont hesitate to call and ask to speak to one of the nurses for clinical concerns.  If you have a medical emergency, go to the nearest emergency room or call 911.  A surgeon from [facility] is always on call at the hospital.  For  further questions, please visit [email or web address]    Post Anesthesia Home Care Instructions  Activity: Get plenty of rest for the remainder of the day. A  responsible adult should stay with you for 24 hours following the procedure.  For the next 24 hours, DO NOT: -Drive a car -Paediatric nurse -Drink alcoholic beverages -Take any medication unless instructed by your physician -Make any legal decisions or sign important papers.  Meals: Start with liquid foods such as gelatin or soup. Progress to regular foods as tolerated. Avoid greasy, spicy, heavy foods. If nausea and/or vomiting occur, drink only clear liquids until the nausea and/or vomiting subsides. Call your physician if vomiting continues.  Special Instructions/Symptoms: Your throat may feel dry or sore from the anesthesia or the breathing tube placed in your throat during surgery. If this causes discomfort, gargle with warm salt water. The discomfort should disappear within 24 hours.

## 2013-07-17 NOTE — Anesthesia Procedure Notes (Signed)
Procedure Name: LMA Insertion Date/Time: 07/17/2013 7:37 AM Performed by: Baxter Flattery Pre-anesthesia Checklist: Patient identified, Emergency Drugs available, Suction available and Patient being monitored Patient Re-evaluated:Patient Re-evaluated prior to inductionOxygen Delivery Method: Circle System Utilized Preoxygenation: Pre-oxygenation with 100% oxygen Intubation Type: IV induction Ventilation: Mask ventilation without difficulty LMA: LMA inserted LMA Size: 3.0 Number of attempts: 1 Airway Equipment and Method: bite block Placement Confirmation: positive ETCO2 and breath sounds checked- equal and bilateral Tube secured with: Tape Dental Injury: Teeth and Oropharynx as per pre-operative assessment

## 2013-07-18 ENCOUNTER — Encounter (HOSPITAL_BASED_OUTPATIENT_CLINIC_OR_DEPARTMENT_OTHER): Payer: Self-pay | Admitting: General Surgery

## 2013-07-18 ENCOUNTER — Telehealth (INDEPENDENT_AMBULATORY_CARE_PROVIDER_SITE_OTHER): Payer: Self-pay

## 2013-07-18 ENCOUNTER — Encounter (INDEPENDENT_AMBULATORY_CARE_PROVIDER_SITE_OTHER): Payer: Self-pay

## 2013-07-18 NOTE — Telephone Encounter (Signed)
Pt noified of pathology report to be benign per Dr Donne Hazel. I made the pt an nurse visit on 07/30/13 to have sutures removed per Dr Donne Hazel and an appt to see Dr Donne Hazel for 08/13/13. I wrote a RTW note for the pt to return on 07/21/13 with restrictions for 3 weeks. Pt request I fax note to fx#[phone] attn:Kenny Thompson.

## 2013-07-21 ENCOUNTER — Encounter (INDEPENDENT_AMBULATORY_CARE_PROVIDER_SITE_OTHER): Payer: Self-pay | Admitting: General Surgery

## 2013-07-30 ENCOUNTER — Telehealth (INDEPENDENT_AMBULATORY_CARE_PROVIDER_SITE_OTHER): Payer: Self-pay | Admitting: *Deleted

## 2013-07-30 ENCOUNTER — Encounter (INDEPENDENT_AMBULATORY_CARE_PROVIDER_SITE_OTHER): Payer: 59

## 2013-07-30 NOTE — Telephone Encounter (Signed)
Pt came in today for suture removal on the right shoulder.  Pt states has been doing very well since the surgery.  I evaluated the rt shoulder, there was no redness, no swelling, and no drainage.   I removed the sutures and placed 2 steri strips over the incision to give it more closure. Pt was advised that she didn't have to do anything special, that she can continue to do daily activities and the steri strips would fall off when they were ready.  Pt was advised we will see pt at her f/u visit with Dr. Donne Hazel.  She was understanding and in agreeance.  Anderson Malta

## 2013-08-13 ENCOUNTER — Encounter (INDEPENDENT_AMBULATORY_CARE_PROVIDER_SITE_OTHER): Payer: 59 | Admitting: General Surgery

## 2013-08-28 ENCOUNTER — Ambulatory Visit (INDEPENDENT_AMBULATORY_CARE_PROVIDER_SITE_OTHER): Payer: 59 | Admitting: General Surgery

## 2013-08-28 ENCOUNTER — Encounter (INDEPENDENT_AMBULATORY_CARE_PROVIDER_SITE_OTHER): Payer: Self-pay | Admitting: General Surgery

## 2013-08-28 VITALS — BP 120/70 | HR 79 | Temp 98.4°F | Resp 16 | Ht 59.0 in | Wt 137.2 lb

## 2013-08-28 DIAGNOSIS — Z09 Encounter for follow-up examination after completed treatment for conditions other than malignant neoplasm: Secondary | ICD-10-CM

## 2013-08-28 NOTE — Progress Notes (Signed)
Subjective:     Patient ID: Jeanette Herring, female   DOB: 02-Jun-1970, 43 y.o.   MRN: [id number]  HPI  63 yof s/p right shoulder lipoma removal returns today without complaint.  Reviewed path and provided copy.  Review of Systems     Objective:   Physical Exam Healing incision in the right shoulder    Assessment:     S/p right shoulder lipoma excision     Plan:     She will return to normal activity. I will have her come back and see me as needed.

## 2014-03-06 ENCOUNTER — Other Ambulatory Visit: Payer: 59

## 2014-03-06 DIAGNOSIS — Z0000 Encounter for general adult medical examination without abnormal findings: Secondary | ICD-10-CM

## 2014-03-06 LAB — CBC WITH DIFFERENTIAL/PLATELET
BASOS PCT: 0 % (ref 0–1)
Basophils Absolute: 0 10*3/uL (ref 0.0–0.1)
EOS ABS: 0.1 10*3/uL (ref 0.0–0.7)
EOS PCT: 1 % (ref 0–5)
HCT: 39 % (ref 36.0–46.0)
HEMOGLOBIN: 12.8 g/dL (ref 12.0–15.0)
Lymphocytes Relative: 20 % (ref 12–46)
Lymphs Abs: 1.5 10*3/uL (ref 0.7–4.0)
MCH: 29.4 pg (ref 26.0–34.0)
MCHC: 32.8 g/dL (ref 30.0–36.0)
MCV: 89.4 fL (ref 78.0–100.0)
MONOS PCT: 13 % — AB (ref 3–12)
Monocytes Absolute: 1 10*3/uL (ref 0.1–1.0)
NEUTROS PCT: 66 % (ref 43–77)
Neutro Abs: 5 10*3/uL (ref 1.7–7.7)
Platelets: 263 10*3/uL (ref 150–400)
RBC: 4.36 MIL/uL (ref 3.87–5.11)
RDW: 14.2 % (ref 11.5–15.5)
WBC: 7.5 10*3/uL (ref 4.0–10.5)

## 2014-03-06 LAB — TSH: TSH: 1.153 u[IU]/mL (ref 0.350–4.500)

## 2014-03-07 LAB — COMPLETE METABOLIC PANEL WITH GFR
ALT: <8 U/L (ref 0–35)
AST: 11 U/L (ref 0–37)
Albumin: 3.9 g/dL (ref 3.5–5.2)
Alkaline Phosphatase: 48 U/L (ref 39–117)
BUN: 10 mg/dL (ref 6–23)
CALCIUM: 8.9 mg/dL (ref 8.4–10.5)
CO2: 22 mEq/L (ref 19–32)
Chloride: 107 mEq/L (ref 96–112)
Creat: 0.59 mg/dL (ref 0.50–1.10)
GLUCOSE: 91 mg/dL (ref 70–99)
POTASSIUM: 4.6 meq/L (ref 3.5–5.3)
SODIUM: 138 meq/L (ref 135–145)
Total Bilirubin: 1.1 mg/dL (ref 0.2–1.2)
Total Protein: 6.3 g/dL (ref 6.0–8.3)

## 2014-03-07 LAB — LIPID PANEL
CHOL/HDL RATIO: 2.5 ratio
CHOLESTEROL: 142 mg/dL (ref 0–200)
HDL: 57 mg/dL (ref >39)
LDL CALC: 77 mg/dL (ref 0–99)
Triglycerides: 38 mg/dL (ref <150)
VLDL: 8 mg/dL (ref 0–40)

## 2014-03-09 ENCOUNTER — Encounter: Payer: Self-pay | Admitting: Family Medicine

## 2014-03-09 ENCOUNTER — Ambulatory Visit (INDEPENDENT_AMBULATORY_CARE_PROVIDER_SITE_OTHER): Payer: 59 | Admitting: Family Medicine

## 2014-03-09 VITALS — BP 98/68 | HR 76 | Temp 98.8°F | Resp 14 | Ht 59.0 in | Wt 136.0 lb

## 2014-03-09 DIAGNOSIS — Z0000 Encounter for general adult medical examination without abnormal findings: Secondary | ICD-10-CM

## 2014-03-09 NOTE — Progress Notes (Signed)
Subjective:    Patient ID: Jeanette Herring, female    DOB: 07/10/1970, 43 y.o.   MRN: [id number]  HPI Patient is here to day for complete physical exam. She is a very pleasant 43 year old white female with no medical concerns. Her mammogram was performed this year and is up-to-date. Her Pap smear was performed last year. She has no history of abnormal Pap smears. She is not due for repeat Pap smear for 2 years. She refuses the flu shot today. Her tetanus vaccine is up-to-date her most recent lab work as listed below: Lab on 03/06/2014  Component Date Value Ref Range Status  . WBC 03/06/2014 7.5  4.0 - 10.5 K/uL Final  . RBC 03/06/2014 4.36  3.87 - 5.11 MIL/uL Final  . Hemoglobin 03/06/2014 12.8  12.0 - 15.0 g/dL Final  . HCT 03/06/2014 39.0  36.0 - 46.0 % Final  . MCV 03/06/2014 89.4  78.0 - 100.0 fL Final  . MCH 03/06/2014 29.4  26.0 - 34.0 pg Final  . MCHC 03/06/2014 32.8  30.0 - 36.0 g/dL Final  . RDW 03/06/2014 14.2  11.5 - 15.5 % Final  . Platelets 03/06/2014 263  150 - 400 K/uL Final  . Neutrophils Relative % 03/06/2014 66  43 - 77 % Final  . Neutro Abs 03/06/2014 5.0  1.7 - 7.7 K/uL Final  . Lymphocytes Relative 03/06/2014 20  12 - 46 % Final  . Lymphs Abs 03/06/2014 1.5  0.7 - 4.0 K/uL Final  . Monocytes Relative 03/06/2014 13* 3 - 12 % Final  . Monocytes Absolute 03/06/2014 1.0  0.1 - 1.0 K/uL Final  . Eosinophils Relative 03/06/2014 1  0 - 5 % Final  . Eosinophils Absolute 03/06/2014 0.1  0.0 - 0.7 K/uL Final  . Basophils Relative 03/06/2014 0  0 - 1 % Final  . Basophils Absolute 03/06/2014 0.0  0.0 - 0.1 K/uL Final  . Smear Review 03/06/2014 Criteria for review not met   Final  . Cholesterol 03/06/2014 142  0 - 200 mg/dL Final   Comment: ATP III Classification:       < 200        mg/dL        Desirable      200 - 239     mg/dL        Borderline High      >= 240        mg/dL        High     . Triglycerides 03/06/2014 38  <150 mg/dL Final  . HDL 03/06/2014 57  >39 mg/dL  Final  . Total CHOL/HDL Ratio 03/06/2014 2.5   Final  . VLDL 03/06/2014 8  0 - 40 mg/dL Final  . LDL Cholesterol 03/06/2014 77  0 - 99 mg/dL Final   Comment:   Total Cholesterol/HDL Ratio:CHD Risk                        Coronary Heart Disease Risk Table                                        Men       Women          1/2 Average Risk              3.4        3.3  Average Risk              5.0        4.4           2X Average Risk              9.6        7.1           3X Average Risk             23.4       11.0 Use the calculated Patient Ratio above and the CHD Risk table  to determine the patient's CHD Risk. ATP III Classification (LDL):       < 100        mg/dL         Optimal      100 - 129     mg/dL         Near or Above Optimal      130 - 159     mg/dL         Borderline High      160 - 189     mg/dL         High       > 190        mg/dL         Very High     . TSH 03/06/2014 1.153  0.350 - 4.500 uIU/mL Final  . Sodium 03/06/2014 138  135 - 145 mEq/L Final  . Potassium 03/06/2014 4.6  3.5 - 5.3 mEq/L Final  . Chloride 03/06/2014 107  96 - 112 mEq/L Final  . CO2 03/06/2014 22  19 - 32 mEq/L Final  . Glucose, Bld 03/06/2014 91  70 - 99 mg/dL Final  . BUN 03/06/2014 10  6 - 23 mg/dL Final  . Creat 03/06/2014 0.59  0.50 - 1.10 mg/dL Final  . Total Bilirubin 03/06/2014 1.1  0.2 - 1.2 mg/dL Final  . Alkaline Phosphatase 03/06/2014 48  39 - 117 U/L Final  . AST 03/06/2014 11  0 - 37 U/L Final  . ALT 03/06/2014 <8  0 - 35 U/L Final  . Total Protein 03/06/2014 6.3  6.0 - 8.3 g/dL Final  . Albumin 03/06/2014 3.9  3.5 - 5.2 g/dL Final  . Calcium 03/06/2014 8.9  8.4 - 10.5 mg/dL Final  . GFR, Est African American 03/06/2014 >89   Final  . GFR, Est Non African American 03/06/2014 >89   Final   Comment:   The estimated GFR is a calculation valid for adults (>=5 years old) that uses the CKD-EPI algorithm to adjust for age and sex. It is   not to be used for children, pregnant  women, hospitalized patients,    patients on dialysis, or with rapidly changing kidney function. According to the NKDEP, eGFR >89 is normal, 60-89 shows mild impairment, 30-59 shows moderate impairment, 15-29 shows severe impairment and <15 is ESRD.      Past Medical History  Diagnosis Date  . Headache(784.0)     sinus  . Lipoma of shoulder 06/2013    right  . Cough 07/14/2013   Past Surgical History  Procedure Laterality Date  . No past surgeries    . Lipoma excision Right 07/17/2013    Procedure: EXCISION RIGHT SHOULDER LIPOMA;  Surgeon: Rolm Bookbinder, MD;  Location: [facility];  Service: General;  Laterality: Right;   No current outpatient prescriptions on file prior to visit.  No current facility-administered medications on file prior to visit.   No Known Allergies History   Social History  . Marital Status: Married    Spouse Name: N/A    Number of Children: N/A  . Years of Education: N/A   Occupational History  . Not on file.   Social History Main Topics  . Smoking status: Former Research scientist (life sciences)  . Smokeless tobacco: Never Used     Comment: quit smoking 4 years ago  . Alcohol Use: Yes     Comment: occasionally  . Drug Use: No  . Sexual Activity: Yes   Other Topics Concern  . Not on file   Social History Narrative   No family history on file.    Review of Systems  All other systems reviewed and are negative.      Objective:   Physical Exam  Constitutional: She is oriented to person, place, and time. She appears well-developed and well-nourished. No distress.  HENT:  Head: Normocephalic and atraumatic.  Right Ear: External ear normal.  Left Ear: External ear normal.  Nose: Nose normal.  Mouth/Throat: Oropharynx is clear and moist. No oropharyngeal exudate.  Eyes: Conjunctivae and EOM are normal. Pupils are equal, round, and reactive to light. Right eye exhibits no discharge. Left eye exhibits no discharge. No scleral icterus.  Neck:  Normal range of motion. Neck supple. No JVD present. No tracheal deviation present. No thyromegaly present.  Cardiovascular: Normal rate, regular rhythm, normal heart sounds and intact distal pulses.  Exam reveals no gallop and no friction rub.   No murmur heard. Pulmonary/Chest: Effort normal and breath sounds normal. No stridor. No respiratory distress. She has no wheezes. She has no rales. She exhibits no tenderness.  Abdominal: Soft. Bowel sounds are normal. She exhibits no distension. There is no tenderness. There is no rebound and no guarding.  Musculoskeletal: Normal range of motion. She exhibits no edema or tenderness.  Lymphadenopathy:    She has no cervical adenopathy.  Neurological: She is alert and oriented to person, place, and time. She has normal reflexes. She displays normal reflexes. No cranial nerve deficit. She exhibits normal muscle tone. Coordination normal.  Skin: Skin is warm. No rash noted. She is not diaphoretic. No erythema. No pallor.  Psychiatric: She has a normal mood and affect. Her behavior is normal. Judgment and thought content normal.  Vitals reviewed.         Assessment & Plan:  Routine general medical examination at a health care facility  Patient's physical exam is completely normal. Her lab work is excellent. She refuses the flu shot. I recommended a Pap smear every 3 years and a mammogram every 2 years until age 27. I recommended a colonoscopy at age 56. Also recommended that she begin to take calcium 1000 mg a day and vitamin D at least 400 units per day.

## 2014-07-06 ENCOUNTER — Encounter: Payer: Self-pay | Admitting: Family Medicine

## 2014-07-06 ENCOUNTER — Ambulatory Visit (INDEPENDENT_AMBULATORY_CARE_PROVIDER_SITE_OTHER): Payer: 59 | Admitting: Family Medicine

## 2014-07-06 VITALS — BP 110/68 | HR 80 | Temp 98.7°F | Resp 14 | Ht 59.0 in | Wt 131.0 lb

## 2014-07-06 DIAGNOSIS — J329 Chronic sinusitis, unspecified: Secondary | ICD-10-CM | POA: Diagnosis not present

## 2014-07-06 MED ORDER — AMOXICILLIN-POT CLAVULANATE 875-125 MG PO TABS: 1.0000 | ORAL_TABLET | Freq: Two times a day (BID) | ORAL | Status: DC

## 2014-07-06 MED ORDER — FLUTICASONE PROPIONATE 50 MCG/ACT NA SUSP: 2.0000 | Freq: Every day | NASAL | Status: DC

## 2014-07-06 NOTE — Progress Notes (Signed)
   Subjective:    Patient ID: Jeanette Herring, female    DOB: [birth year], 44 y.o.   MRN: [id number]  HPI  Patient presents with 10 days of frontal and maxillary sinus pain and pressure, postnasal drip, headache, rhinorrhea. She is tried over-the-counter allergy and sinus medication without relief. She denies any cough. She denies any sore throat. She reports persistent pain and pressure in her frontal sinus area. Past Medical History  Diagnosis Date  . Headache(784.0)     sinus  . Lipoma of shoulder 06/2013    right  . Cough 07/14/2013   Past Surgical History  Procedure Laterality Date  . No past surgeries    . Lipoma excision Right 07/17/2013    Procedure: EXCISION RIGHT SHOULDER LIPOMA;  Surgeon: Rolm Bookbinder, MD;  Location: [facility];  Service: General;  Laterality: Right;   No current outpatient prescriptions on file prior to visit.   No current facility-administered medications on file prior to visit.   No Known Allergies History   Social History  . Marital Status: Married    Spouse Name: N/A  . Number of Children: N/A  . Years of Education: N/A   Occupational History  . Not on file.   Social History Main Topics  . Smoking status: Former Research scientist (life sciences)  . Smokeless tobacco: Never Used     Comment: quit smoking 4 years ago  . Alcohol Use: Yes     Comment: occasionally  . Drug Use: No  . Sexual Activity: Yes   Other Topics Concern  . Not on file   Social History Narrative     Review of Systems  All other systems reviewed and are negative.      Objective:   Physical Exam  Constitutional: She appears well-developed and well-nourished. No distress.  HENT:  Head: Normocephalic and atraumatic.  Right Ear: Tympanic membrane, external ear and ear canal normal.  Left Ear: Tympanic membrane, external ear and ear canal normal.  Nose: Mucosal edema and rhinorrhea present. Right sinus exhibits maxillary sinus tenderness and frontal sinus tenderness.  Left sinus exhibits maxillary sinus tenderness and frontal sinus tenderness.  Mouth/Throat: Oropharynx is clear and moist.  Eyes: Conjunctivae are normal. Pupils are equal, round, and reactive to light.  Neck: Neck supple.  Cardiovascular: Normal rate, regular rhythm and normal heart sounds.   No murmur heard. Pulmonary/Chest: Effort normal and breath sounds normal. No respiratory distress. She has no wheezes. She has no rales. She exhibits no tenderness.  Lymphadenopathy:    She has no cervical adenopathy.  Skin: She is not diaphoretic.  Vitals reviewed.         Assessment & Plan:  Rhinosinusitis - Plan: fluticasone (FLONASE) 50 MCG/ACT nasal spray, amoxicillin-clavulanate (AUGMENTIN) 875-125 MG per tablet, DISCONTINUED: amoxicillin-clavulanate (AUGMENTIN) 875-125 MG per tablet  Begin Flonase 2 sprays each nostril daily for the next 3-5 days. If no better at that time begin Augmentin 875 mg by mouth twice a day for 10 days. Right now believe the patient has more allergy issues causing sinus inflammation and less likely an infection. If the symptoms do not improve after 3-5 days of flonase, add Augmentin.

## 2014-12-14 ENCOUNTER — Encounter: Payer: Self-pay | Admitting: Physician Assistant

## 2014-12-14 ENCOUNTER — Ambulatory Visit (INDEPENDENT_AMBULATORY_CARE_PROVIDER_SITE_OTHER): Payer: 59 | Admitting: Physician Assistant

## 2014-12-14 VITALS — BP 98/60 | HR 68 | Temp 99.1°F | Resp 16 | Ht 59.0 in | Wt 123.0 lb

## 2014-12-14 DIAGNOSIS — N63 Unspecified lump in breast: Secondary | ICD-10-CM

## 2014-12-14 DIAGNOSIS — Z1239 Encounter for other screening for malignant neoplasm of breast: Secondary | ICD-10-CM

## 2014-12-14 DIAGNOSIS — N6320 Unspecified lump in the left breast, unspecified quadrant: Secondary | ICD-10-CM

## 2014-12-14 NOTE — Progress Notes (Signed)
    Patient ID: Jeanette Herring MRN: [id number], DOB: [birth year], 44 y.o. Date of Encounter: 12/14/2014, 1:52 PM    Chief Complaint:  Chief Complaint  Patient presents with  . breast exam    patient feels lump, left breast.     HPI: 44 y.o. year old white female presents with above. She says that in the past she had seen Dr. Harriett Rush and had a palpable mass. Says at that time follow-up test showed that it was fibrocystic. Says that she did not have to have biopsy. Says that she has been having annual mammograms. She did have one last year. Says that she recently received a letter to schedule her annual mammogram. Says that that made her think about doing self breast exam. When she did self breast exam she felt a mass in her left breast. She called the diagnostic center to schedule mammogram and when she informed them that she had felt a lump they told her she needed to see her provider to order a diagnostic test instead of her going for her regular screening mammogram. Therefore, she is here for a visit today. No other complaints or concerns.     Home Meds:   Outpatient Prescriptions Prior to Visit  Medication Sig Dispense Refill  . amoxicillin-clavulanate (AUGMENTIN) 875-125 MG per tablet Take 1 tablet by mouth 2 (two) times daily. (Patient not taking: Reported on 12/14/2014) 20 tablet 0  . fluticasone (FLONASE) 50 MCG/ACT nasal spray Place 2 sprays into both nostrils daily. (Patient not taking: Reported on 12/14/2014) 16 g 6   No facility-administered medications prior to visit.    Allergies: No Known Allergies    Review of Systems: See HPI for pertinent ROS. All other ROS negative.    Physical Exam: Blood pressure 98/60, pulse 68, temperature 99.1 F (37.3 C), temperature source Oral, resp. rate 16, height 4\' 11"  (1.499 m), weight 123 lb (55.792 kg)., Body mass index is 24.83 kg/(m^2). General:  WNWD WF. Appears in no acute distress. Neck: Supple. No thyromegaly. No  lymphadenopathy. Right Breast: Normal. Inspection is normal. No skin changes. No masses with palpation. No nipple discharge. Left Breast: At 1 o'clock position, at outer edge of breast, there is a mass. Ill-defined borders but feels like approx 1 inch diameter, nonmobile.  Remainder of left breast exam normal. No other masses with palpation. Inspection is normal. No skin changes. No nipple discharge. Lungs: Clear bilaterally to auscultation without wheezes, rales, or rhonchi. Breathing is unlabored. Heart: Regular rhythm. No murmurs, rubs, or gallops. Msk:  Strength and tone normal for age. Extremities/Skin: Warm and dry.  Neuro: Alert and oriented X 3. Moves all extremities spontaneously. Gait is normal. CNII-XII grossly in tact. Psych:  Responds to questions appropriately with a normal affect.     ASSESSMENT AND PLAN:  44 y.o. year old female with   1. Mass of left breast - MM Digital Diagnostic Unilat L; Future - US BREAST COMPLETE UNI LEFT INC AXILLA; Future  2. Screening for breast cancer - MM Digital Diagnostic Unilat L; Future - MM Digital Screening Unilat R; Future  Will obtain diagnostic mammogram and ultrasound left breast and screening mammogram right breast. Further recommendations pending these results.   [address] [place], [place], [facility] 12/14/2014 1:52 PM

## 2014-12-16 LAB — HM MAMMOGRAPHY: HM MAMMO: NORMAL

## 2014-12-30 ENCOUNTER — Other Ambulatory Visit: Payer: Self-pay | Admitting: Family Medicine

## 2015-01-25 ENCOUNTER — Encounter: Payer: Self-pay | Admitting: Family Medicine

## 2015-03-30 ENCOUNTER — Encounter: Payer: Self-pay | Admitting: Family Medicine

## 2015-03-30 ENCOUNTER — Ambulatory Visit (INDEPENDENT_AMBULATORY_CARE_PROVIDER_SITE_OTHER): Payer: 59 | Admitting: Family Medicine

## 2015-03-30 VITALS — BP 100/60 | HR 62 | Temp 98.9°F | Resp 14 | Ht 59.0 in | Wt 124.0 lb

## 2015-03-30 DIAGNOSIS — Z0000 Encounter for general adult medical examination without abnormal findings: Secondary | ICD-10-CM

## 2015-03-30 LAB — CBC WITH DIFFERENTIAL/PLATELET
BASOS PCT: 1 % (ref 0–1)
Basophils Absolute: 0.1 10*3/uL (ref 0.0–0.1)
EOS ABS: 0.1 10*3/uL (ref 0.0–0.7)
EOS PCT: 1 % (ref 0–5)
HEMATOCRIT: 36.8 % (ref 36.0–46.0)
Hemoglobin: 12.1 g/dL (ref 12.0–15.0)
LYMPHS PCT: 22 % (ref 12–46)
Lymphs Abs: 1.3 10*3/uL (ref 0.7–4.0)
MCH: 29.4 pg (ref 26.0–34.0)
MCHC: 32.9 g/dL (ref 30.0–36.0)
MCV: 89.5 fL (ref 78.0–100.0)
MONO ABS: 0.7 10*3/uL (ref 0.1–1.0)
MPV: 11.1 fL (ref 8.6–12.4)
Monocytes Relative: 11 % (ref 3–12)
Neutro Abs: 3.9 10*3/uL (ref 1.7–7.7)
Neutrophils Relative %: 65 % (ref 43–77)
PLATELETS: 302 10*3/uL (ref 150–400)
RBC: 4.11 MIL/uL (ref 3.87–5.11)
RDW: 13.8 % (ref 11.5–15.5)
WBC: 6 10*3/uL (ref 4.0–10.5)

## 2015-03-30 LAB — LIPID PANEL
Cholesterol: 128 mg/dL (ref 125–200)
HDL: 63 mg/dL (ref >=46)
LDL CALC: 58 mg/dL (ref <130)
TRIGLYCERIDES: 34 mg/dL (ref <150)
Total CHOL/HDL Ratio: 2 Ratio (ref <=5.0)
VLDL: 7 mg/dL (ref <30)

## 2015-03-30 LAB — COMPLETE METABOLIC PANEL WITH GFR
ALBUMIN: 3.8 g/dL (ref 3.6–5.1)
ALK PHOS: 48 U/L (ref 33–115)
ALT: 9 U/L (ref 6–29)
AST: 12 U/L (ref 10–30)
BILIRUBIN TOTAL: 0.9 mg/dL (ref 0.2–1.2)
BUN: 11 mg/dL (ref 7–25)
CALCIUM: 8.5 mg/dL — AB (ref 8.6–10.2)
CO2: 23 mmol/L (ref 20–31)
CREATININE: 0.64 mg/dL (ref 0.50–1.10)
Chloride: 108 mmol/L (ref 98–110)
GFR, Est Non African American: >89 mL/min (ref >=60)
Glucose, Bld: 91 mg/dL (ref 70–99)
Potassium: 4.7 mmol/L (ref 3.5–5.3)
Sodium: 138 mmol/L (ref 135–146)
TOTAL PROTEIN: 6 g/dL — AB (ref 6.1–8.1)

## 2015-03-30 NOTE — Progress Notes (Signed)
Subjective:    Patient ID: Jeanette Herring, female    DOB: 1970-09-11, 44 y.o.   MRN: [id number]  HPI  Patient is here to day for complete physical exam. She is a very pleasant 44 year old white female with no medical concerns. Her mammogram was performed this year and is up-to-date. Her Pap smear was performed last in 2014. She has no history of abnormal Pap smears. She is not due for repeat Pap smear in 2017. She refuses the flu shot today. Her tetanus vaccine is up-to-date her most recent lab work as listed below: No visits with results within 1 Month(s) from this visit. Latest known visit with results is:  Orders Only on 12/30/2014  Component Date Value Ref Range Status  . HM Mammogram 12/16/2014 Normal -[facility]   Final   Past Medical History  Diagnosis Date  . Headache(784.0)     sinus  . Lipoma of shoulder 06/2013    right  . Cough 07/14/2013   Past Surgical History  Procedure Laterality Date  . No past surgeries    . Lipoma excision Right 07/17/2013    Procedure: EXCISION RIGHT SHOULDER LIPOMA;  Surgeon: Rolm Bookbinder, MD;  Location: [facility];  Service: General;  Laterality: Right;   Current Outpatient Prescriptions on File Prior to Visit  Medication Sig Dispense Refill  . amoxicillin-clavulanate (AUGMENTIN) 875-125 MG per tablet Take 1 tablet by mouth 2 (two) times daily. (Patient not taking: Reported on 12/14/2014) 20 tablet 0  . fluticasone (FLONASE) 50 MCG/ACT nasal spray Place 2 sprays into both nostrils daily. (Patient not taking: Reported on 12/14/2014) 16 g 6   No current facility-administered medications on file prior to visit.   No Known Allergies Social History   Social History  . Marital Status: Married    Spouse Name: N/A  . Number of Children: N/A  . Years of Education: N/A   Occupational History  . Not on file.   Social History Main Topics  . Smoking status: Former Research scientist (life sciences)  . Smokeless tobacco: Never Used     Comment: quit smoking  4 years ago  . Alcohol Use: Yes     Comment: occasionally  . Drug Use: No  . Sexual Activity: Yes   Other Topics Concern  . Not on file   Social History Narrative   No family history on file.    Review of Systems  All other systems reviewed and are negative.      Objective:   Physical Exam  Constitutional: She is oriented to person, place, and time. She appears well-developed and well-nourished. No distress.  HENT:  Head: Normocephalic and atraumatic.  Right Ear: External ear normal.  Left Ear: External ear normal.  Nose: Nose normal.  Mouth/Throat: Oropharynx is clear and moist. No oropharyngeal exudate.  Eyes: Conjunctivae and EOM are normal. Pupils are equal, round, and reactive to light. Right eye exhibits no discharge. Left eye exhibits no discharge. No scleral icterus.  Neck: Normal range of motion. Neck supple. No JVD present. No tracheal deviation present. No thyromegaly present.  Cardiovascular: Normal rate, regular rhythm, normal heart sounds and intact distal pulses.  Exam reveals no gallop and no friction rub.   No murmur heard. Pulmonary/Chest: Effort normal and breath sounds normal. No stridor. No respiratory distress. She has no wheezes. She has no rales. She exhibits no tenderness.  Abdominal: Soft. Bowel sounds are normal. She exhibits no distension. There is no tenderness. There is no rebound and no guarding.  Musculoskeletal: Normal range of motion. She exhibits no edema or tenderness.  Lymphadenopathy:    She has no cervical adenopathy.  Neurological: She is alert and oriented to person, place, and time. She has normal reflexes. No cranial nerve deficit. She exhibits normal muscle tone. Coordination normal.  Skin: Skin is warm. No rash noted. She is not diaphoretic. No erythema. No pallor.  Psychiatric: She has a normal mood and affect. Her behavior is normal. Judgment and thought content normal.  Vitals reviewed.         Assessment & Plan:    Routine general medical examination at a health care facility - Plan: COMPLETE METABOLIC PANEL WITH GFR, CBC with Differential/Platelet, Lipid panel   Patient's physical exam is completely normal.  Given the hamartoma that was discovered on ultrasound earlier I recommended a mammogram next year. I recommended a Pap smear next year. I will check a CBC, CMP, fasting lab work including a cholesterol panel. Also recommended a flu shot which she politely declined.

## 2015-04-01 ENCOUNTER — Encounter: Payer: Self-pay | Admitting: Family Medicine

## 2017-01-19 LAB — HM MAMMOGRAPHY

## 2017-01-25 ENCOUNTER — Encounter: Payer: Self-pay | Admitting: Family Medicine

## 2017-05-22 ENCOUNTER — Ambulatory Visit: Payer: Managed Care, Other (non HMO) | Admitting: Family Medicine

## 2017-05-22 ENCOUNTER — Encounter: Payer: Self-pay | Admitting: Family Medicine

## 2017-05-22 VITALS — BP 110/68 | HR 72 | Temp 98.6°F | Resp 12 | Ht 59.0 in | Wt 124.0 lb

## 2017-05-22 DIAGNOSIS — G510 Bell's palsy: Secondary | ICD-10-CM

## 2017-05-22 MED ORDER — VALACYCLOVIR HCL 1 G PO TABS: 1000.0000 mg | ORAL_TABLET | Freq: Three times a day (TID) | ORAL | 0 refills | Status: DC

## 2017-05-22 MED ORDER — PREDNISONE 20 MG PO TABS: 60.0000 mg | ORAL_TABLET | Freq: Every day | ORAL | 0 refills | Status: DC

## 2017-05-22 NOTE — Progress Notes (Signed)
Subjective:    Patient ID: Jeanette Herring, female    DOB: [birth year], 47 y.o.   MRN: [id number]  HPI Patient presents today with weakness on the right side of her face. When she tries to smile, there is weakness on the right side of her lips and she is unable to complete the smile. She is unable to pucker her lips on the right side of her face. Her blink is diminished on the right side and she is unable to completely close her right eye. The forehead muscles also seen to be involved as she is unable to wrinkle her brow on the right side of her face. However she denies any blurry vision or double vision. She denies any numbness on the right side of her face. There are no vesicles or lesions around the ear and no evidence of zoster. Past Medical History:  Diagnosis Date  . Cough 07/14/2013  . Headache(784.0)    sinus  . 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 LIPOMA EXCISION Right 07/17/2013   Procedure: EXCISION RIGHT SHOULDER LIPOMA;  Surgeon: Rolm Bookbinder, MD;  Location: [facility];  Service: General;  Laterality: Right;  . NO PAST SURGERIES     Current Outpatient Medications on File Prior to Visit  Medication Sig Dispense Refill  . loratadine (CLARITIN) 10 MG tablet Take 10 mg by mouth daily.     No current facility-administered medications on file prior to visit.    No Known Allergies Social History   Socioeconomic History  . Marital status: Married    Spouse name: Not on file  . Number of children: Not on file  . Years of education: Not on file  . Highest education level: Not on file  Social Needs  . Financial resource strain: Not on file  . Food insecurity - worry: Not on file  . Food insecurity - inability: Not on file  . Transportation needs - medical: Not on file  . Transportation needs - non-medical: Not on file  Occupational History  . Not on file  Tobacco Use  . Smoking status: Former Research scientist (life sciences)    . Smokeless tobacco: Never Used  . Tobacco comment: quit smoking 4 years ago  Substance and Sexual Activity  . Alcohol use: Yes    Comment: occasionally  . Drug use: No  . Sexual activity: Yes  Other Topics Concern  . Not on file  Social History Narrative  . Not on file      Review of Systems  All other systems reviewed and are negative.      Objective:   Physical Exam  Constitutional: She is oriented to person, place, and time.  HENT:  Right Ear: External ear normal.  Left Ear: External ear normal.  Eyes: Conjunctivae are normal. Pupils are equal, round, and reactive to light. Right eye exhibits no discharge.  Cardiovascular: Normal rate, regular rhythm and normal heart sounds.  No murmur heard. Pulmonary/Chest: Effort normal and breath sounds normal. No respiratory distress. She has no wheezes. She has no rales.  Neurological: She is alert and oriented to person, place, and time. She has normal reflexes. She displays normal reflexes. A cranial nerve deficit is present. She exhibits normal muscle tone. Coordination normal.  Vitals reviewed.         Assessment & Plan:  Bell palsy - Plan: predniSONE (DELTASONE) 20 MG tablet, valACYclovir (VALTREX) 1000 MG tablet  Patient clinically appears  to have Bell's palsy. Begin prednisone 60 mg a day for 7 days and Valtrex 1 g by mouth 3 times a day for 7 days. Anticipate resolution within 3 weeks. Discussed the natural history of Bell's palsy. Any other neurologic deficit anywhere else in the body should prompt immediate emergent evaluation.

## 2017-11-30 ENCOUNTER — Ambulatory Visit: Payer: Managed Care, Other (non HMO) | Admitting: Family Medicine

## 2017-12-10 ENCOUNTER — Encounter: Payer: Self-pay | Admitting: Family Medicine

## 2018-01-21 LAB — HM MAMMOGRAPHY

## 2018-02-11 ENCOUNTER — Encounter: Payer: Self-pay | Admitting: *Deleted

## 2019-01-29 ENCOUNTER — Encounter: Payer: Self-pay | Admitting: Family Medicine

## 2019-01-29 LAB — HM MAMMOGRAPHY

## 2019-05-09 ENCOUNTER — Encounter: Payer: Managed Care, Other (non HMO) | Admitting: Family Medicine

## 2019-06-26 ENCOUNTER — Other Ambulatory Visit: Payer: Self-pay

## 2019-06-26 ENCOUNTER — Encounter: Payer: Self-pay | Admitting: Family Medicine

## 2019-06-26 ENCOUNTER — Ambulatory Visit: Payer: Managed Care, Other (non HMO) | Admitting: Family Medicine

## 2019-06-26 VITALS — BP 110/62 | HR 80 | Temp 97.1°F | Resp 12 | Ht 59.0 in | Wt 134.0 lb

## 2019-06-26 DIAGNOSIS — R590 Localized enlarged lymph nodes: Secondary | ICD-10-CM

## 2019-06-26 MED ORDER — CEPHALEXIN 500 MG PO CAPS: 500.0000 mg | ORAL_CAPSULE | Freq: Three times a day (TID) | ORAL | 0 refills | Status: DC

## 2019-06-26 NOTE — Progress Notes (Signed)
Subjective:    Patient ID: Jeanette Herring, female    DOB: 12/26/1970, 49 y.o.   MRN: [id number]  HPI Patient is a very pleasant 49 year old Caucasian female who presents with a 1 to 2-week history of a painful knot in her left axilla.  She denies any cat scratch or injury to the left arm or left breast.  However in her left axilla, there is a poorly circumscribed, tender, subcutaneous nodule that appears to be a swollen lymph node.  It has vague borders and it feels deep to the touch however it is approximately 2 cm in diameter.  There is no redness or warmth or overlying cellulitis.  There is no redness extending onto the breast although the patient does report that her breast a bit more tender recently.  However she jokes that it may be more tender because she has been rubbing it constantly due to anxiety over what it may be.  There are no other lymph nodes in her head or neck that are swollen.  I am unable to palpate any supraclavicular lymph nodes, anterior cervical lymph nodes, submandibular lymph nodes.  There is no axillary lymphadenopathy in her right axilla.  She just had a mammogram and ultrasound last fall that was completely normal. Past Medical History:  Diagnosis Date  . Cough 07/14/2013  . Headache(784.0)    sinus  . 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 LIPOMA EXCISION Right 07/17/2013   Procedure: EXCISION RIGHT SHOULDER LIPOMA;  Surgeon: Rolm Bookbinder, MD;  Location: [facility];  Service: General;  Laterality: Right;  . NO PAST SURGERIES     No current outpatient medications on file prior to visit.   No current facility-administered medications on file prior to visit.   No Known Allergies Social History   Socioeconomic History  . Marital status: Married    Spouse name: Not on file  . Number of children: Not on file  . Years of education: Not on file  . Highest education level: Not on file    Occupational History  . Not on file  Tobacco Use  . Smoking status: Former Research scientist (life sciences)  . Smokeless tobacco: Never Used  . Tobacco comment: quit smoking 4 years ago  Substance and Sexual Activity  . Alcohol use: Yes    Comment: occasionally  . Drug use: No  . Sexual activity: Yes  Other Topics Concern  . Not on file  Social History Narrative  . Not on file   Social Determinants of Health   Financial Resource Strain:   . Difficulty of Paying Living Expenses: Not on file  Food Insecurity:   . Worried About Charity fundraiser in the Last Year: Not on file  . Ran Out of Food in the Last Year: Not on file  Transportation Needs:   . Lack of Transportation (Medical): Not on file  . Lack of Transportation (Non-Medical): Not on file  Physical Activity:   . Days of Exercise per Week: Not on file  . Minutes of Exercise per Session: Not on file  Stress:   . Feeling of Stress : Not on file  Social Connections:   . Frequency of Communication with Friends and Family: Not on file  . Frequency of Social Gatherings with Friends and Family: Not on file  . Attends Religious Services: Not on file  . Active Member of Clubs or Organizations: Not on file  . Attends  Club or Organization Meetings: Not on file  . Marital Status: Not on file  Intimate Partner Violence:   . Fear of Current or Ex-Partner: Not on file  . Emotionally Abused: Not on file  . Physically Abused: Not on file  . Sexually Abused: Not on file      Review of Systems  All other systems reviewed and are negative.      Objective:   Physical Exam Constitutional:      General: She is not in acute distress.    Appearance: Normal appearance. She is normal weight. She is not ill-appearing.  Cardiovascular:     Rate and Rhythm: Normal rate and regular rhythm.  Pulmonary:     Effort: Pulmonary effort is normal.     Breath sounds: Normal breath sounds.  Chest:    Lymphadenopathy:     Upper Body:     Left upper body:  Axillary adenopathy present.  Neurological:     Mental Status: She is alert.           Assessment & Plan:  Lymphadenopathy, axillary - Plan: [place] AXILLA LEFT  I believe this is most likely a reactive lymph node in the left axilla most likely due to inflammation from shaving or some other benign etiology.  I see no evidence of an abscess or cellulitis or breast cancer.  I tried to reassure the patient as best I can.  Exam was performed with a chaperone present.  If the redness starts to develop or the lymph node begins to swell, I would recommend starting Keflex 500 mg p.o. 3 times daily.  However I will obtain an ultrasound of the left axilla just to ensure that the diagnosis is accurate.  I anticipate self-limited resolution over the next 3 to 4 weeks.

## 2019-06-27 ENCOUNTER — Ambulatory Visit: Payer: Managed Care, Other (non HMO) | Admitting: Family Medicine

## 2019-06-27 ENCOUNTER — Other Ambulatory Visit: Payer: Self-pay | Admitting: Family Medicine

## 2019-06-27 DIAGNOSIS — R590 Localized enlarged lymph nodes: Secondary | ICD-10-CM

## 2019-07-04 ENCOUNTER — Encounter: Payer: Self-pay | Admitting: Family Medicine

## 2019-07-04 ENCOUNTER — Ambulatory Visit (INDEPENDENT_AMBULATORY_CARE_PROVIDER_SITE_OTHER): Payer: Managed Care, Other (non HMO) | Admitting: Family Medicine

## 2019-07-04 ENCOUNTER — Other Ambulatory Visit: Payer: Self-pay

## 2019-07-04 VITALS — BP 100/60 | HR 86 | Temp 96.5°F | Resp 14 | Ht 59.0 in | Wt 136.0 lb

## 2019-07-04 DIAGNOSIS — Z124 Encounter for screening for malignant neoplasm of cervix: Secondary | ICD-10-CM

## 2019-07-04 DIAGNOSIS — Z0000 Encounter for general adult medical examination without abnormal findings: Secondary | ICD-10-CM

## 2019-07-04 NOTE — Progress Notes (Signed)
Subjective:    Patient ID: Jeanette Herring, female    DOB: 1970/09/09, 49 y.o.   MRN: [id number]  HPI  Patient is a very pleasant 49 year old Caucasian female here today for complete physical exam.  I recently saw the patient for a tender nodule in her left axilla.  I believe it is a lymph node that may be slightly inflamed.  Patient states that she has not seen much change in the last week despite taking Keflex.  Today I am having a difficult time feeling the lymph node in her left axilla.  I definitely do not feel a hard mass.  She has an appointment to have her ultrasound and mammogram performed on next Friday.  It has been more than 5 years since her last Pap smear so she is due.  She did have a grandfather who had colon cancer but he was elderly when it occurred.  Therefore she does not require colonoscopy until age 6.  Otherwise she is doing well with no concerns. Past Medical History:  Diagnosis Date  . Cough 07/14/2013  . Headache(784.0)    sinus  . 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 LIPOMA EXCISION Right 07/17/2013   Procedure: EXCISION RIGHT SHOULDER LIPOMA;  Surgeon: Rolm Bookbinder, MD;  Location: [facility];  Service: General;  Laterality: Right;  . NO PAST SURGERIES     Current Outpatient Medications on File Prior to Visit  Medication Sig Dispense Refill  . cephALEXin (KEFLEX) 500 MG capsule Take 1 capsule (500 mg total) by mouth 3 (three) times daily. 21 capsule 0   No current facility-administered medications on file prior to visit.   No Known Allergies Social History   Socioeconomic History  . Marital status: Married    Spouse name: Not on file  . Number of children: Not on file  . Years of education: Not on file  . Highest education level: Not on file  Occupational History  . Not on file  Tobacco Use  . Smoking status: Former Research scientist (life sciences)  . Smokeless tobacco: Never Used  . Tobacco comment:  quit smoking 4 years ago  Substance and Sexual Activity  . Alcohol use: Yes    Comment: occasionally  . Drug use: No  . Sexual activity: Yes  Other Topics Concern  . Not on file  Social History Narrative  . Not on file   Social Determinants of Health   Financial Resource Strain:   . Difficulty of Paying Living Expenses: Not on file  Food Insecurity:   . Worried About Charity fundraiser in the Last Year: Not on file  . Ran Out of Food in the Last Year: Not on file  Transportation Needs:   . Lack of Transportation (Medical): Not on file  . Lack of Transportation (Non-Medical): Not on file  Physical Activity:   . Days of Exercise per Week: Not on file  . Minutes of Exercise per Session: Not on file  Stress:   . Feeling of Stress : Not on file  Social Connections:   . Frequency of Communication with Friends and Family: Not on file  . Frequency of Social Gatherings with Friends and Family: Not on file  . Attends Religious Services: Not on file  . Active Member of Clubs or Organizations: Not on file  . Attends Archivist Meetings: Not on file  . Marital Status: Not on file  Intimate Partner  Violence:   . Fear of Current or Ex-Partner: Not on file  . Emotionally Abused: Not on file  . Physically Abused: Not on file  . Sexually Abused: Not on file   No family history on file.    Review of Systems  All other systems reviewed and are negative.      Objective:   Physical Exam  Constitutional: She is oriented to person, place, and time. She appears well-developed and well-nourished. No distress.  HENT:  Head: Normocephalic and atraumatic.  Right Ear: External ear normal.  Left Ear: External ear normal.  Nose: Nose normal.  Mouth/Throat: Oropharynx is clear and moist. No oropharyngeal exudate.  Eyes: Pupils are equal, round, and reactive to light. Conjunctivae and EOM are normal. Right eye exhibits no discharge. Left eye exhibits no discharge. No scleral  icterus.  Neck: No JVD present. No tracheal deviation present. No thyromegaly present.  Cardiovascular: Normal rate, regular rhythm, normal heart sounds and intact distal pulses. Exam reveals no gallop and no friction rub.  No murmur heard. Pulmonary/Chest: Effort normal and breath sounds normal. No stridor. No respiratory distress. She has no wheezes. She has no rales. She exhibits no tenderness.  Abdominal: Soft. Bowel sounds are normal. She exhibits no distension. There is no abdominal tenderness. There is no rebound and no guarding.  Genitourinary:    Vagina and uterus normal.  There is no rash, tenderness, lesion or injury on the right labia. There is no rash, tenderness, lesion or injury on the left labia. Cervix exhibits no motion tenderness and no friability. Right adnexum displays no mass and no tenderness. Left adnexum displays no mass and no tenderness.    No vaginal discharge, erythema or tenderness.  No erythema or tenderness in the vagina.    No signs of injury in the vagina.   Musculoskeletal:        General: No tenderness or edema. Normal range of motion.     Cervical back: Normal range of motion and neck supple.  Lymphadenopathy:    She has no cervical adenopathy.  Neurological: She is alert and oriented to person, place, and time. She has normal reflexes. No cranial nerve deficit. She exhibits normal muscle tone. Coordination normal.  Skin: Skin is warm. No rash noted. She is not diaphoretic. No erythema. No pallor.  Psychiatric: She has a normal mood and affect. Her behavior is normal. Judgment and thought content normal.  Vitals reviewed.         Assessment & Plan:  General medical exam - Plan: CBC with Differential/Platelet, COMPLETE METABOLIC PANEL WITH GFR, CANCELED: Lipid panel  Cervical cancer screening - Plan: PAP, Thin Prep w/HPV rflx HPV Type 16/18  Patient's physical exam is completely normal.  Pap smear was sent to pathology in a labeled container.  Patient  has a mammogram scheduled for next Friday and an axillary ultrasound.  However I believe the lump in the left axilla is benign lymph node.  I will check a CBC, CMP, and offered a fasting lipid panel today.  However the patient is not fasting and therefore I will just check a CBC and a CMP.  Patient can return at any time for a fasting lipid panel.  Patient is no longer smoking.  Her blood pressure is outstanding.  Regular anticipatory guidance is provided.

## 2019-07-05 LAB — CBC WITH DIFFERENTIAL/PLATELET
Absolute Monocytes: 979 cells/uL — ABNORMAL HIGH (ref 200–950)
Basophils Absolute: 29 cells/uL (ref 0–200)
Basophils Relative: 0.3 %
Eosinophils Absolute: 134 cells/uL (ref 15–500)
Eosinophils Relative: 1.4 %
HCT: 37.5 % (ref 35.0–45.0)
Hemoglobin: 12.8 g/dL (ref 11.7–15.5)
Lymphs Abs: 1997 cells/uL (ref 850–3900)
MCH: 30.5 pg (ref 27.0–33.0)
MCHC: 34.1 g/dL (ref 32.0–36.0)
MCV: 89.3 fL (ref 80.0–100.0)
MPV: 11.4 fL (ref 7.5–12.5)
Monocytes Relative: 10.2 %
Neutro Abs: 6461 cells/uL (ref 1500–7800)
Neutrophils Relative %: 67.3 %
Platelets: 302 10*3/uL (ref 140–400)
RBC: 4.2 10*6/uL (ref 3.80–5.10)
RDW: 12.9 % (ref 11.0–15.0)
Total Lymphocyte: 20.8 %
WBC: 9.6 10*3/uL (ref 3.8–10.8)

## 2019-07-05 LAB — COMPLETE METABOLIC PANEL WITH GFR
AG Ratio: 1.6 (calc) (ref 1.0–2.5)
ALT: 9 U/L (ref 6–29)
AST: 12 U/L (ref 10–35)
Albumin: 3.9 g/dL (ref 3.6–5.1)
Alkaline phosphatase (APISO): 60 U/L (ref 31–125)
BUN: 13 mg/dL (ref 7–25)
CO2: 23 mmol/L (ref 20–32)
Calcium: 9.2 mg/dL (ref 8.6–10.2)
Chloride: 105 mmol/L (ref 98–110)
Creat: 0.72 mg/dL (ref 0.50–1.10)
GFR, Est African American: 115 mL/min/{1.73_m2} (ref >=60)
GFR, Est Non African American: 99 mL/min/{1.73_m2} (ref >=60)
Globulin: 2.5 g/dL (calc) (ref 1.9–3.7)
Glucose, Bld: 89 mg/dL (ref 65–99)
Potassium: 4.3 mmol/L (ref 3.5–5.3)
Sodium: 137 mmol/L (ref 135–146)
Total Bilirubin: 0.7 mg/dL (ref 0.2–1.2)
Total Protein: 6.4 g/dL (ref 6.1–8.1)

## 2019-07-07 ENCOUNTER — Encounter: Payer: Self-pay | Admitting: Family Medicine

## 2019-07-08 LAB — PAP, TP IMAGING W/ HPV RNA, RFLX HPV TYPE 16,18/45: HPV DNA High Risk: NOT DETECTED

## 2019-07-11 ENCOUNTER — Ambulatory Visit
Admission: RE | Admit: 2019-07-11 | Discharge: 2019-07-11 | Disposition: A | Discharge status: Home / Self Care | Payer: Managed Care, Other (non HMO) | Source: Ambulatory Visit | Attending: Family Medicine | Admitting: Family Medicine

## 2019-07-11 ENCOUNTER — Other Ambulatory Visit: Payer: Self-pay

## 2019-07-11 DIAGNOSIS — R590 Localized enlarged lymph nodes: Secondary | ICD-10-CM

## 2019-10-29 DIAGNOSIS — B078 Other viral warts: Secondary | ICD-10-CM | POA: Diagnosis not present

## 2020-02-02 ENCOUNTER — Encounter: Payer: Self-pay | Admitting: Family Medicine

## 2020-02-02 DIAGNOSIS — Z1231 Encounter for screening mammogram for malignant neoplasm of breast: Secondary | ICD-10-CM | POA: Diagnosis not present

## 2020-04-13 ENCOUNTER — Telehealth: Payer: Self-pay | Admitting: Family Medicine

## 2020-04-13 NOTE — Telephone Encounter (Signed)
Patient dropped off form that needs providers signature stating shew as seen here on 07/04/2019 for a CPE. Form is in your folder.  CB# [phone]

## 2020-04-14 NOTE — Telephone Encounter (Signed)
Form placed in Providers folder to be signed.

## 2020-04-20 NOTE — Telephone Encounter (Signed)
LVM for patient advising her this paper was ready for pick up.

## 2020-07-05 ENCOUNTER — Encounter: Payer: Managed Care, Other (non HMO) | Admitting: Family Medicine

## 2020-07-09 ENCOUNTER — Encounter: Payer: Self-pay | Admitting: Family Medicine

## 2020-07-09 ENCOUNTER — Ambulatory Visit (INDEPENDENT_AMBULATORY_CARE_PROVIDER_SITE_OTHER): Payer: BC Managed Care – PPO | Admitting: Family Medicine

## 2020-07-09 ENCOUNTER — Other Ambulatory Visit: Payer: Self-pay

## 2020-07-09 VITALS — BP 98/58 | HR 72 | Temp 98.7°F | Resp 14 | Ht 59.0 in | Wt 131.0 lb

## 2020-07-09 DIAGNOSIS — Z1211 Encounter for screening for malignant neoplasm of colon: Secondary | ICD-10-CM

## 2020-07-09 DIAGNOSIS — Z13220 Encounter for screening for lipoid disorders: Secondary | ICD-10-CM | POA: Diagnosis not present

## 2020-07-09 DIAGNOSIS — Z0000 Encounter for general adult medical examination without abnormal findings: Secondary | ICD-10-CM | POA: Diagnosis not present

## 2020-07-09 DIAGNOSIS — Z136 Encounter for screening for cardiovascular disorders: Secondary | ICD-10-CM | POA: Diagnosis not present

## 2020-07-09 NOTE — Progress Notes (Signed)
Subjective:    Patient ID: Jeanette Herring, female    DOB: 1970-11-22, 50 y.o.   MRN: [id number]  HPI  Patient is a very pleasant 50 year old Caucasian female who presents today for complete physical exam.  Her last Pap smear was performed last year and was completely normal.  There was no evidence of high risk HPV and therefore she is not due for repeat Pap smear until 2024.  Her last mammogram was in October 2021 and was normal.  She schedules this on her own every fall.  She does have a family history of colon cancer in her maternal grandfather.  Her mother also had colon polyps.  She will be 50 years old in November.  We discussed a Cologuard versus colonoscopy and I encouraged her to consider a colonoscopy.  Therefore she agreed to allow me to schedule her to see GI to schedule this.  She is also started noticing perimenopausal symptoms.  She recently had her last period however she states that sometimes it can skip several months even as many as 6.  She is also noticed some vasomotor symptoms and hot flashes.  She is noticed a more difficult time losing weight despite exercising.  She is trying to exercise more and is up to walking a mile a day.  We discussed hormone replacement therapy including its risk and benefits.  At the present time she elects not to pursue hormone replacement therapy but she will let me know if she changes her mind.  She would like to check lab work to ensure that there is not another hormonal issue that could be causing the symptoms and I think it would be prudent to check her thyroid.  Otherwise she is doing well with no concerns.  She appears completely healthy.  Her blood pressure today is outstanding.  She does have numerous freckles particularly on her shoulder and on her back.  She occasionally sees dermatology. Past Medical History:  Diagnosis Date  . Cough 07/14/2013  . Headache(784.0)    sinus  . 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 LIPOMA EXCISION Right 07/17/2013   Procedure: EXCISION RIGHT SHOULDER LIPOMA;  Surgeon: Rolm Bookbinder, MD;  Location: [facility];  Service: General;  Laterality: Right;  . NO PAST SURGERIES     No current outpatient medications on file prior to visit.   No current facility-administered medications on file prior to visit.   No Known Allergies Social History   Socioeconomic History  . Marital status: Married    Spouse name: Not on file  . Number of children: Not on file  . Years of education: Not on file  . Highest education level: Not on file  Occupational History  . Not on file  Tobacco Use  . Smoking status: Former Research scientist (life sciences)  . Smokeless tobacco: Never Used  . Tobacco comment: quit smoking 4 years ago  Substance and Sexual Activity  . Alcohol use: Yes    Comment: occasionally  . Drug use: No  . Sexual activity: Yes  Other Topics Concern  . Not on file  Social History Narrative  . Not on file   Social Determinants of Health   Financial Resource Strain: Not on file  Food Insecurity: Not on file  Transportation Needs: Not on file  Physical Activity: Not on file  Stress: Not on file  Social Connections: Not on file  Intimate Partner Violence: Not on  file   History reviewed. No pertinent family history.    Review of Systems  All other systems reviewed and are negative.      Objective:   Physical Exam Vitals reviewed.  Constitutional:      General: She is not in acute distress.    Appearance: She is well-developed. She is not diaphoretic.  HENT:     Head: Normocephalic and atraumatic.     Right Ear: External ear normal.     Left Ear: External ear normal.     Nose: Nose normal.     Mouth/Throat:     Pharynx: No oropharyngeal exudate.  Eyes:     General: No scleral icterus.       Right eye: No discharge.        Left eye: No discharge.     Conjunctiva/sclera: Conjunctivae normal.     Pupils: Pupils are equal,  round, and reactive to light.  Neck:     Thyroid: No thyromegaly.     Vascular: No JVD.     Trachea: No tracheal deviation.  Cardiovascular:     Rate and Rhythm: Normal rate and regular rhythm.     Heart sounds: Normal heart sounds. No murmur heard. No friction rub. No gallop.   Pulmonary:     Effort: Pulmonary effort is normal. No respiratory distress.     Breath sounds: Normal breath sounds. No stridor. No wheezing or rales.  Chest:     Chest wall: No tenderness.  Abdominal:     General: Bowel sounds are normal. There is no distension.     Palpations: Abdomen is soft.     Tenderness: There is no abdominal tenderness. There is no guarding or rebound.  Genitourinary:    Adnexa:        Right: No mass or tenderness.         Left: No mass or tenderness.    Musculoskeletal:        General: No tenderness. Normal range of motion.     Cervical back: Normal range of motion and neck supple.  Lymphadenopathy:     Cervical: No cervical adenopathy.  Skin:    General: Skin is warm.     Coloration: Skin is not pale.     Findings: No erythema or rash.  Neurological:     Mental Status: She is alert and oriented to person, place, and time.     Cranial Nerves: No cranial nerve deficit.     Motor: No abnormal muscle tone.     Coordination: Coordination normal.     Deep Tendon Reflexes: Reflexes are normal and symmetric.  Psychiatric:        Behavior: Behavior normal.        Thought Content: Thought content normal.        Judgment: Judgment normal.           Assessment & Plan:  General medical exam - Plan: CBC with Differential/Platelet, COMPLETE METABOLIC PANEL WITH GFR, Lipid panel, TSH  Colon cancer screening - Plan: Ambulatory referral to Gastroenterology  Patient's physical exam is completely normal.  Pap smear is up-to-date.  Mammogram is due again in October.  I will schedule the patient to meet with GI to schedule a colonoscopy particularly with her family history.  I will  also check a CBC, CMP, fasting lipid panel and given the hot flashes and fatigue and issues with losing weight I will check a TSH.  However I agree with the patient that I feel  the majority of her symptoms are secondary to hormonal changes associated with menopause.  We discussed the risk and benefits including an increased risk of breast cancer, stroke, and DVT.  After weighing her options at the present time she elects not to proceed with hormone replacement therapy but she can certainly let me know if she changes her mind.

## 2020-07-10 LAB — CBC WITH DIFFERENTIAL/PLATELET
Absolute Monocytes: 731 cells/uL (ref 200–950)
Basophils Absolute: 32 cells/uL (ref 0–200)
Basophils Relative: 0.5 %
Eosinophils Absolute: 63 cells/uL (ref 15–500)
Eosinophils Relative: 1 %
HCT: 38.7 % (ref 35.0–45.0)
Hemoglobin: 12.5 g/dL (ref 11.7–15.5)
Lymphs Abs: 1348 cells/uL (ref 850–3900)
MCH: 29.1 pg (ref 27.0–33.0)
MCHC: 32.3 g/dL (ref 32.0–36.0)
MCV: 90.2 fL (ref 80.0–100.0)
MPV: 11.2 fL (ref 7.5–12.5)
Monocytes Relative: 11.6 %
Neutro Abs: 4127 cells/uL (ref 1500–7800)
Neutrophils Relative %: 65.5 %
Platelets: 281 10*3/uL (ref 140–400)
RBC: 4.29 10*6/uL (ref 3.80–5.10)
RDW: 13.4 % (ref 11.0–15.0)
Total Lymphocyte: 21.4 %
WBC: 6.3 10*3/uL (ref 3.8–10.8)

## 2020-07-10 LAB — COMPLETE METABOLIC PANEL WITH GFR
AG Ratio: 1.9 (calc) (ref 1.0–2.5)
ALT: 9 U/L (ref 6–29)
AST: 12 U/L (ref 10–35)
Albumin: 4.2 g/dL (ref 3.6–5.1)
Alkaline phosphatase (APISO): 50 U/L (ref 31–125)
BUN: 9 mg/dL (ref 7–25)
CO2: 24 mmol/L (ref 20–32)
Calcium: 9.1 mg/dL (ref 8.6–10.2)
Chloride: 107 mmol/L (ref 98–110)
Creat: 0.59 mg/dL (ref 0.50–1.10)
GFR, Est African American: 125 mL/min/{1.73_m2} (ref >=60)
GFR, Est Non African American: 108 mL/min/{1.73_m2} (ref >=60)
Globulin: 2.2 g/dL (calc) (ref 1.9–3.7)
Glucose, Bld: 94 mg/dL (ref 65–99)
Potassium: 4.8 mmol/L (ref 3.5–5.3)
Sodium: 139 mmol/L (ref 135–146)
Total Bilirubin: 1.2 mg/dL (ref 0.2–1.2)
Total Protein: 6.4 g/dL (ref 6.1–8.1)

## 2020-07-10 LAB — LIPID PANEL
Cholesterol: 149 mg/dL (ref <200)
HDL: 52 mg/dL (ref >=50)
LDL Cholesterol (Calc): 83 mg/dL (calc)
Non-HDL Cholesterol (Calc): 97 mg/dL (calc) (ref <130)
Total CHOL/HDL Ratio: 2.9 (calc) (ref <5.0)
Triglycerides: 62 mg/dL (ref <150)

## 2020-07-10 LAB — TSH: TSH: 0.8 mIU/L

## 2020-07-14 ENCOUNTER — Encounter: Payer: Self-pay | Admitting: *Deleted

## 2020-07-29 ENCOUNTER — Telehealth: Payer: Self-pay | Admitting: Family Medicine

## 2020-07-29 NOTE — Telephone Encounter (Signed)
Patient left voicemail today to advise provider of preferred gastroenterologist for colonoscopy; patient prefers Dr. Paulita Fujita (?) at [facility]. Called patient to acknowledge information: no answer. Left a message.

## 2020-07-29 NOTE — Telephone Encounter (Signed)
FYI

## 2020-08-24 NOTE — Telephone Encounter (Signed)
Noted in referral.

## 2021-02-07 DIAGNOSIS — Z1231 Encounter for screening mammogram for malignant neoplasm of breast: Secondary | ICD-10-CM | POA: Diagnosis not present

## 2021-02-07 LAB — HM MAMMOGRAPHY

## 2021-02-10 ENCOUNTER — Encounter: Payer: Self-pay | Admitting: Family Medicine

## 2021-02-10 NOTE — Progress Notes (Signed)
No evidence of malignancy.  

## 2021-07-08 ENCOUNTER — Other Ambulatory Visit: Payer: BC Managed Care – PPO

## 2021-07-08 ENCOUNTER — Other Ambulatory Visit: Payer: Self-pay

## 2021-07-08 DIAGNOSIS — Z136 Encounter for screening for cardiovascular disorders: Secondary | ICD-10-CM

## 2021-07-08 DIAGNOSIS — Z0000 Encounter for general adult medical examination without abnormal findings: Secondary | ICD-10-CM | POA: Diagnosis not present

## 2021-07-08 LAB — CBC WITH DIFFERENTIAL/PLATELET
Absolute Monocytes: 673 cells/uL (ref 200–950)
Basophils Absolute: 52 cells/uL (ref 0–200)
Basophils Relative: 0.7 %
Eosinophils Absolute: 104 cells/uL (ref 15–500)
Eosinophils Relative: 1.4 %
HCT: 38.2 % (ref 35.0–45.0)
Hemoglobin: 12.4 g/dL (ref 11.7–15.5)
Lymphs Abs: 2020 cells/uL (ref 850–3900)
MCH: 29.1 pg (ref 27.0–33.0)
MCHC: 32.5 g/dL (ref 32.0–36.0)
MCV: 89.7 fL (ref 80.0–100.0)
MPV: 10.5 fL (ref 7.5–12.5)
Monocytes Relative: 9.1 %
Neutro Abs: 4551 cells/uL (ref 1500–7800)
Neutrophils Relative %: 61.5 %
Platelets: 345 10*3/uL (ref 140–400)
RBC: 4.26 10*6/uL (ref 3.80–5.10)
RDW: 13.5 % (ref 11.0–15.0)
Total Lymphocyte: 27.3 %
WBC: 7.4 10*3/uL (ref 3.8–10.8)

## 2021-07-08 LAB — COMPREHENSIVE METABOLIC PANEL
AG Ratio: 1.7 (calc) (ref 1.0–2.5)
ALT: 24 U/L (ref 6–29)
AST: 16 U/L (ref 10–35)
Albumin: 4.1 g/dL (ref 3.6–5.1)
Alkaline phosphatase (APISO): 71 U/L (ref 37–153)
BUN: 9 mg/dL (ref 7–25)
CO2: 26 mmol/L (ref 20–32)
Calcium: 9 mg/dL (ref 8.6–10.4)
Chloride: 107 mmol/L (ref 98–110)
Creat: 0.59 mg/dL (ref 0.50–1.03)
Globulin: 2.4 g/dL (calc) (ref 1.9–3.7)
Glucose, Bld: 92 mg/dL (ref 65–99)
Potassium: 4.6 mmol/L (ref 3.5–5.3)
Sodium: 139 mmol/L (ref 135–146)
Total Bilirubin: 0.9 mg/dL (ref 0.2–1.2)
Total Protein: 6.5 g/dL (ref 6.1–8.1)

## 2021-07-08 LAB — LIPID PANEL
Cholesterol: 160 mg/dL (ref <200)
HDL: 68 mg/dL (ref >=50)
LDL Cholesterol (Calc): 80 mg/dL (calc)
Non-HDL Cholesterol (Calc): 92 mg/dL (calc) (ref <130)
Total CHOL/HDL Ratio: 2.4 (calc) (ref <5.0)
Triglycerides: 41 mg/dL (ref <150)

## 2021-07-12 ENCOUNTER — Other Ambulatory Visit: Payer: Self-pay

## 2021-07-12 ENCOUNTER — Ambulatory Visit (INDEPENDENT_AMBULATORY_CARE_PROVIDER_SITE_OTHER): Payer: BC Managed Care – PPO | Admitting: Family Medicine

## 2021-07-12 VITALS — BP 118/62 | HR 82 | Temp 98.4°F | Resp 18 | Wt 135.0 lb

## 2021-07-12 DIAGNOSIS — Z1211 Encounter for screening for malignant neoplasm of colon: Secondary | ICD-10-CM

## 2021-07-12 DIAGNOSIS — Z0000 Encounter for general adult medical examination without abnormal findings: Secondary | ICD-10-CM

## 2021-07-12 MED ORDER — LEVOCETIRIZINE DIHYDROCHLORIDE 5 MG PO TABS: 5.0000 mg | ORAL_TABLET | Freq: Every evening | ORAL | 3 refills | Status: DC

## 2021-07-12 NOTE — Progress Notes (Signed)
? ?Subjective:  ? ? Patient ID: Jeanette Herring, female    DOB: 05-Sep-1970, 51 y.o.   MRN: [id number] ? ?HPI ? ?Patient is a very pleasant 51 year old Caucasian female who presents today for complete physical exam.  Her last Pap smear was performed in 2021 and was completely normal.  There was no evidence of high risk HPV and therefore she is not due for repeat Pap smear until 2024.  Mammogram was 10/22 and was normal.  She does have a family history of colon cancer in her maternal grandfather.  Her mother also had colon polyps.  Patient has yet to have her colonoscopy.  She asked me to go ahead and schedule her for this.  She is currently using black cohosh for hot flashes.  She thinks this is working well enough for her.  She is not interested in hormones at the present time.  We did discuss the risk of breast cancer, stroke, and DVT on hormone replacement therapy.  I explained to the patient that the risk between the ages of 23 and 75 is minimal.  However at the present time she is not interested in hormone replacement therapy.  She is not taking calcium or vitamin D. ?Past Medical History:  ?Diagnosis Date  ? Cough 07/14/2013  ? Headache(784.0)   ? sinus  ? Lipoma of shoulder 06/2013  ? right  ? ?Past Surgical History:  ?Procedure Laterality Date  ? LIPOMA EXCISION Right 07/17/2013  ? Procedure: EXCISION RIGHT SHOULDER LIPOMA;  Surgeon: Rolm Bookbinder, MD;  Location: [facility];  Service: General;  Laterality: Right;  ? NO PAST SURGERIES    ? ?No current outpatient medications on file prior to visit.  ? ?No current facility-administered medications on file prior to visit.  ? ?No Known Allergies ?Social History  ? ?Socioeconomic History  ? Marital status: Married  ?  Spouse name: Not on file  ? Number of children: Not on file  ? Years of education: Not on file  ? Highest education level: Not on file  ?Occupational History  ? Not on file  ?Tobacco Use  ? Smoking status: Former  ? Smokeless tobacco:  Never  ? Tobacco comments:  ?  quit smoking 4 years ago  ?Substance and Sexual Activity  ? Alcohol use: Yes  ?  Comment: occasionally  ? Drug use: No  ? Sexual activity: Yes  ?Other Topics Concern  ? Not on file  ?Social History Narrative  ? Not on file  ? ?Social Determinants of Health  ? ?Financial Resource Strain: Not on file  ?Food Insecurity: Not on file  ?Transportation Needs: Not on file  ?Physical Activity: Not on file  ?Stress: Not on file  ?Social Connections: Not on file  ?Intimate Partner Violence: Not on file  ? ?No family history on file. ? ? ? ?Review of Systems  ?All other systems reviewed and are negative. ? ?   ?Objective:  ? Physical Exam ?Vitals reviewed.  ?Constitutional:   ?   General: She is not in acute distress. ?   Appearance: She is well-developed. She is not diaphoretic.  ?HENT:  ?   Head: Normocephalic and atraumatic.  ?   Right Ear: External ear normal.  ?   Left Ear: External ear normal.  ?   Nose: Nose normal.  ?   Mouth/Throat:  ?   Pharynx: No oropharyngeal exudate.  ?Eyes:  ?   General: No scleral icterus.    ?  Right eye: No discharge.     ?   Left eye: No discharge.  ?   Conjunctiva/sclera: Conjunctivae normal.  ?   Pupils: Pupils are equal, round, and reactive to light.  ?Neck:  ?   Thyroid: No thyromegaly.  ?   Vascular: No JVD.  ?   Trachea: No tracheal deviation.  ?Cardiovascular:  ?   Rate and Rhythm: Normal rate and regular rhythm.  ?   Heart sounds: Normal heart sounds. No murmur heard. ?  No friction rub. No gallop.  ?Pulmonary:  ?   Effort: Pulmonary effort is normal. No respiratory distress.  ?   Breath sounds: Normal breath sounds. No stridor. No wheezing or rales.  ?Chest:  ?   Chest wall: No tenderness.  ?Abdominal:  ?   General: Bowel sounds are normal. There is no distension.  ?   Palpations: Abdomen is soft.  ?   Tenderness: There is no abdominal tenderness. There is no guarding or rebound.  ?Genitourinary: ?   Adnexa:     ?   Right: No mass or tenderness.      ?    Left: No mass or tenderness.    ?Musculoskeletal:     ?   General: No tenderness. Normal range of motion.  ?   Cervical back: Normal range of motion and neck supple.  ?Lymphadenopathy:  ?   Cervical: No cervical adenopathy.  ?Skin: ?   General: Skin is warm.  ?   Coloration: Skin is not pale.  ?   Findings: No erythema or rash.  ?Neurological:  ?   Mental Status: She is alert and oriented to person, place, and time.  ?   Cranial Nerves: No cranial nerve deficit.  ?   Motor: No abnormal muscle tone.  ?   Coordination: Coordination normal.  ?   Deep Tendon Reflexes: Reflexes are normal and symmetric.  ?Psychiatric:     ?   Behavior: Behavior normal.     ?   Thought Content: Thought content normal.     ?   Judgment: Judgment normal.  ? ? ? ? ? ?   ?Assessment & Plan:  ?Colon cancer screening - Plan: Ambulatory referral to Gastroenterology ? ?General medical exam ?Patient's physical exam is outstanding.  Her lab work is excellent.  We discussed the shingles vaccine.  We also discussed the COVID booster.  She defers these at the present time.  I will schedule the patient for colonoscopy.  Her mammogram is up-to-date.  Her Pap smear is not due again until 2024.  Her mother did suffer a hip fracture in her late 67s early 50s however the patient attributes this to alcohol.  I would recommend screening the patient for osteoporosis at 19.  I did encourage her to take calcium 1200 mg a day and vitamin D 1000 units a day.  The remainder of her preventative care is up-to-date.  Her lab work is outstanding. ?

## 2021-07-15 ENCOUNTER — Encounter: Payer: BC Managed Care – PPO | Admitting: Family Medicine

## 2021-08-23 DIAGNOSIS — D1721 Benign lipomatous neoplasm of skin and subcutaneous tissue of right arm: Secondary | ICD-10-CM | POA: Diagnosis not present

## 2021-12-01 DIAGNOSIS — L821 Other seborrheic keratosis: Secondary | ICD-10-CM | POA: Diagnosis not present

## 2021-12-01 DIAGNOSIS — D225 Melanocytic nevi of trunk: Secondary | ICD-10-CM | POA: Diagnosis not present

## 2021-12-01 DIAGNOSIS — D2262 Melanocytic nevi of left upper limb, including shoulder: Secondary | ICD-10-CM | POA: Diagnosis not present

## 2021-12-01 DIAGNOSIS — D1721 Benign lipomatous neoplasm of skin and subcutaneous tissue of right arm: Secondary | ICD-10-CM | POA: Diagnosis not present

## 2022-02-20 DIAGNOSIS — Z1231 Encounter for screening mammogram for malignant neoplasm of breast: Secondary | ICD-10-CM | POA: Diagnosis not present

## 2022-02-20 LAB — HM MAMMOGRAPHY

## 2022-02-26 IMAGING — MG MM DIGITAL DIAGNOSTIC UNILAT*L* W/ TOMO W/ CAD
6 series · 6 of 18 positions shown · non-contrast
Comparison: Previous exam(s).

CLINICAL DATA: 48-year-old female presenting with a new palpable
area of concern at the base of the left arm/left axilla

EXAM:
DIGITAL DIAGNOSTIC LEFT MAMMOGRAM WITH TOMO
ULTRASOUND LEFT AXILLA

[L TAN synth-2D]
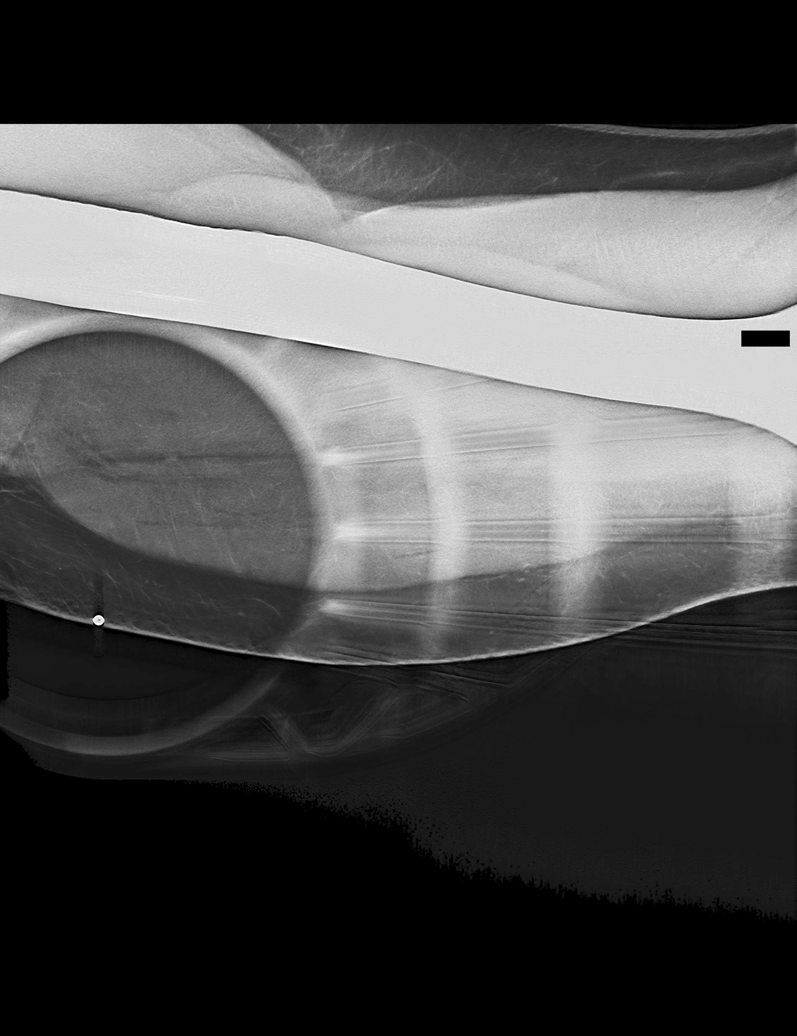

[L CC synth-2D]
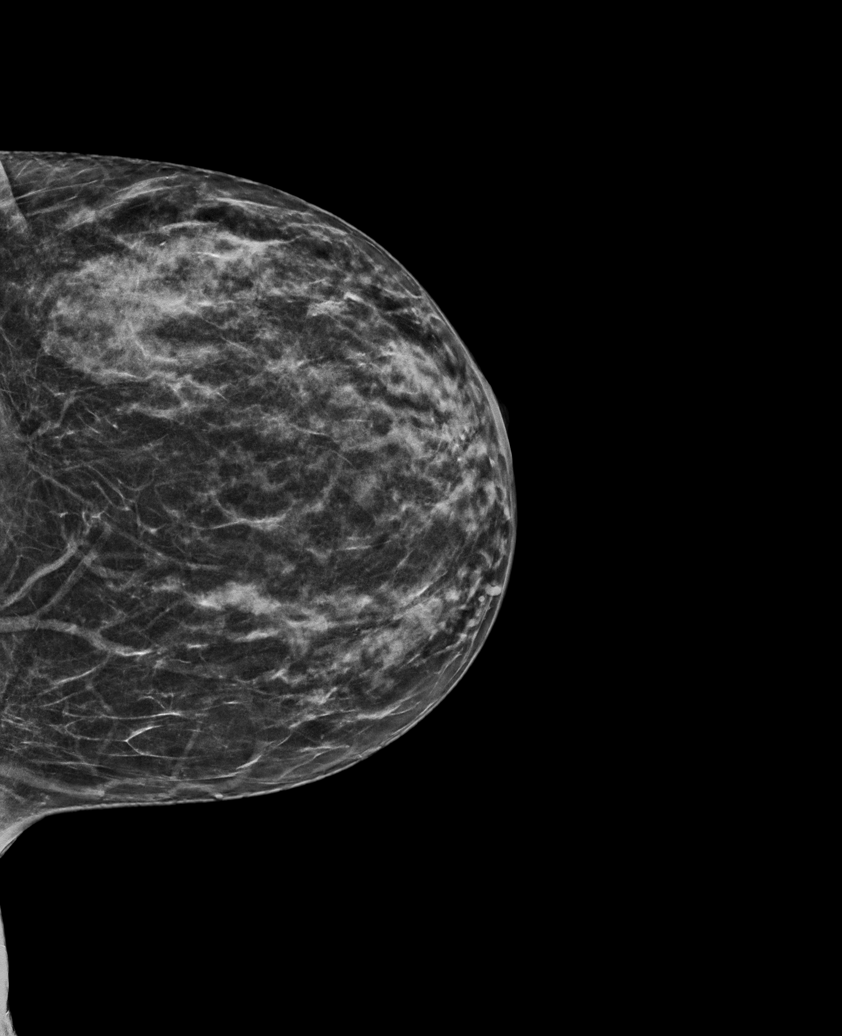

[L MLO synth-2D]
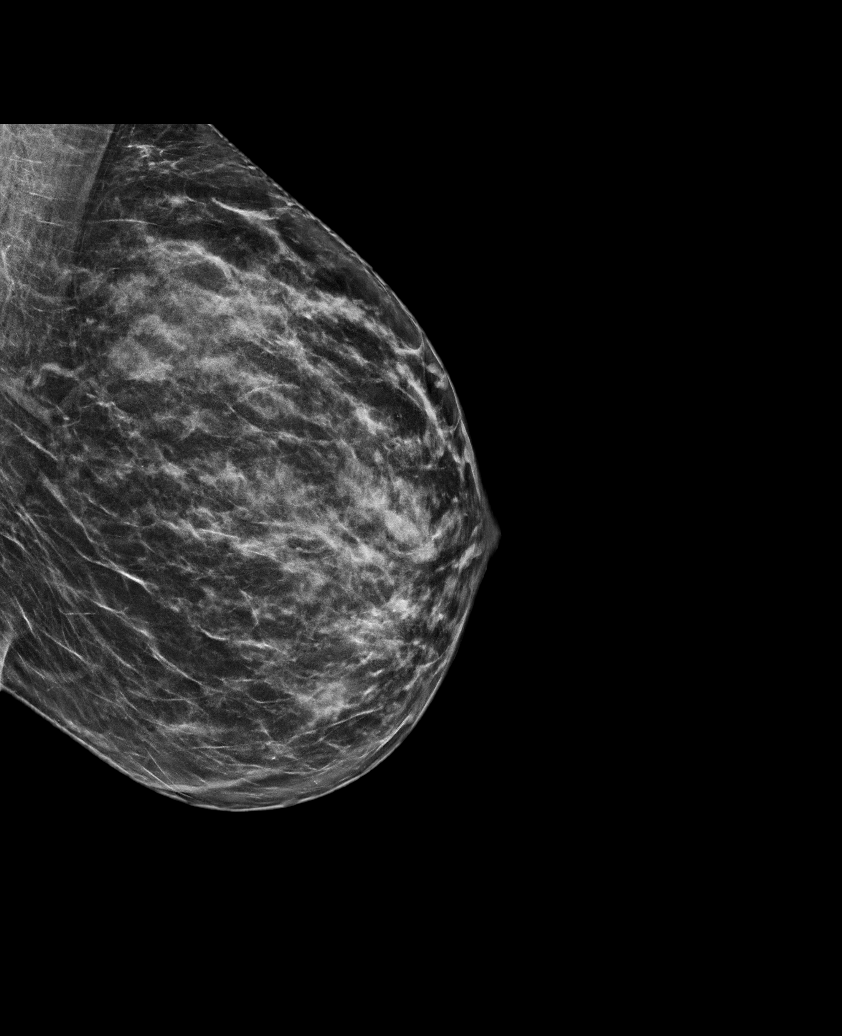

[L MLO tomo · tomo slice 33/64.0]
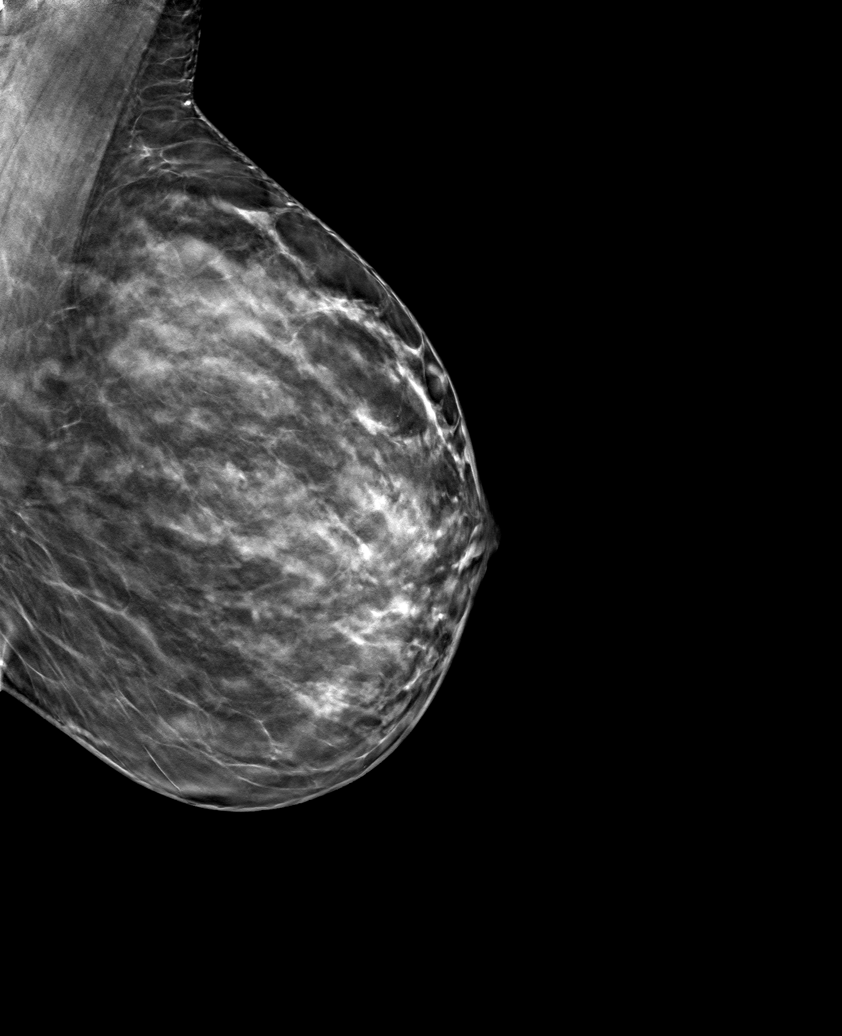

[L TAN tomo · tomo slice 29/57.0]
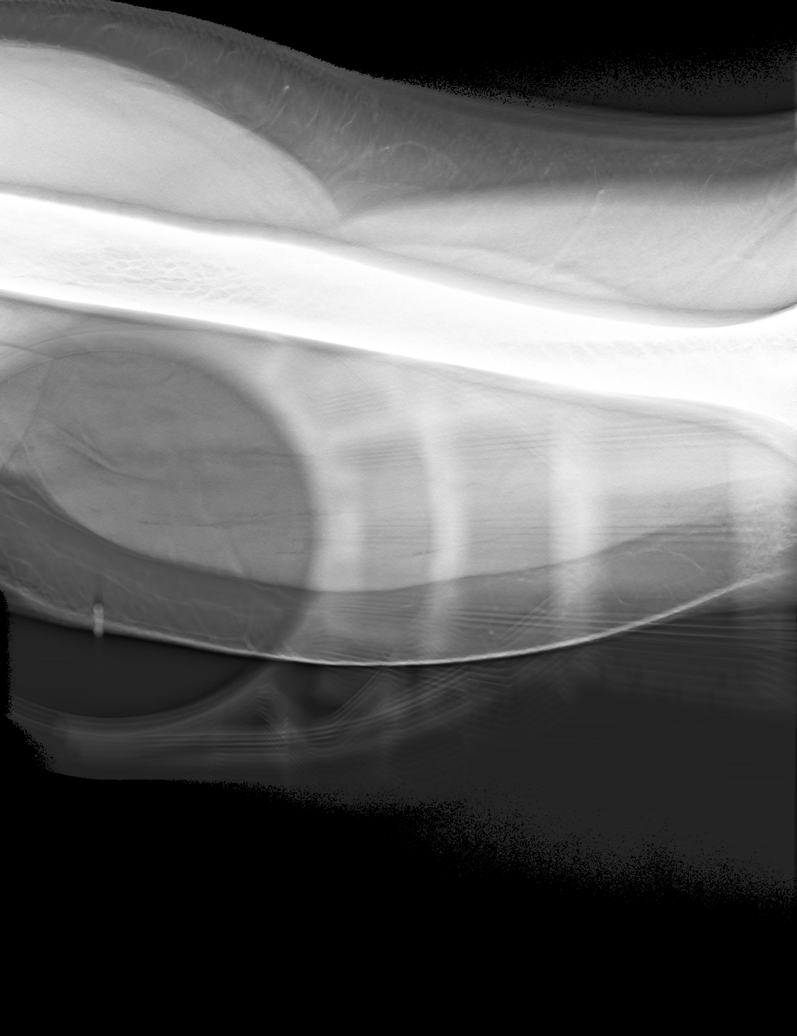

[L CC tomo · tomo slice 29/58.0]
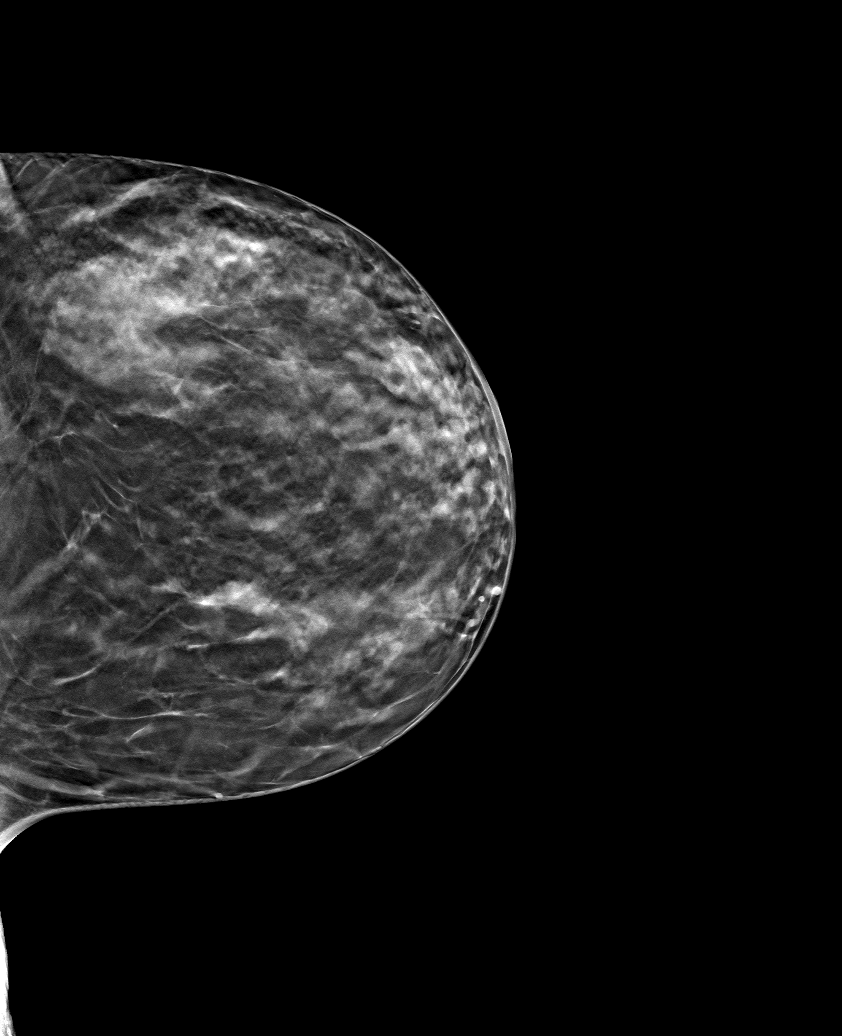

[6 of 18 positions shown; findings below may reference images not displayed]

ACR Breast Density Category c: The breast tissue is heterogeneously
dense, which may obscure small masses.
FINDINGS: Mammogram: No suspicious mass, distortion, or microcalcifications
are identified to suggest presence of malignancy in the left breast.
Spot compression tomosynthesis views were performed at the palpable
site of concern in the left axilla/base of the left arm. There is no
abnormality.

On physical exam, I palpate normal muscle and soft tissue at the
site of concern at the base of the left arm. No fixed discrete mass.

Ultrasound:

Targeted ultrasound is performed in the high left axilla at the
palpable site of concern demonstrating normal tissue and muscle.
There is no suspicious cystic or solid mass. No abnormal lymph node.
IMPRESSION: At the palpable site of concern in the high left axilla/base of the
left arm there is no mammographic or sonographic evidence of
malignancy.

RECOMMENDATION:
Return to routine annual screening mammography which will be due in
December 2019.

I have discussed the findings and recommendations with the patient.
If applicable, a reminder letter will be sent to the patient
regarding the next appointment.

BI-RADS CATEGORY  1: Negative.

## 2022-05-18 ENCOUNTER — Encounter: Payer: Self-pay | Admitting: Family Medicine

## 2022-07-14 ENCOUNTER — Encounter: Payer: Self-pay | Admitting: Family Medicine

## 2022-08-31 ENCOUNTER — Encounter: Payer: Managed Care, Other (non HMO) | Admitting: Family Medicine

## 2022-09-22 ENCOUNTER — Other Ambulatory Visit: Payer: PRIVATE HEALTH INSURANCE

## 2022-09-22 DIAGNOSIS — Z13220 Encounter for screening for lipoid disorders: Secondary | ICD-10-CM

## 2022-09-22 DIAGNOSIS — R5383 Other fatigue: Secondary | ICD-10-CM

## 2022-09-23 LAB — CBC WITH DIFFERENTIAL/PLATELET
Absolute Monocytes: 663 cells/uL (ref 200–950)
Basophils Absolute: 31 cells/uL (ref 0–200)
Basophils Relative: 0.5 %
Eosinophils Absolute: 93 cells/uL (ref 15–500)
Eosinophils Relative: 1.5 %
HCT: 37.4 % (ref 35.0–45.0)
Hemoglobin: 12.3 g/dL (ref 11.7–15.5)
Lymphs Abs: 1550 cells/uL (ref 850–3900)
MCH: 28.9 pg (ref 27.0–33.0)
MCHC: 32.9 g/dL (ref 32.0–36.0)
MCV: 87.8 fL (ref 80.0–100.0)
MPV: 11.1 fL (ref 7.5–12.5)
Monocytes Relative: 10.7 %
Neutro Abs: 3863 cells/uL (ref 1500–7800)
Neutrophils Relative %: 62.3 %
Platelets: 298 10*3/uL (ref 140–400)
RBC: 4.26 10*6/uL (ref 3.80–5.10)
RDW: 13.2 % (ref 11.0–15.0)
Total Lymphocyte: 25 %
WBC: 6.2 10*3/uL (ref 3.8–10.8)

## 2022-09-23 LAB — LIPID PANEL
Cholesterol: 160 mg/dL (ref <200)
HDL: 77 mg/dL (ref >=50)
LDL Cholesterol (Calc): 73 mg/dL (calc)
Non-HDL Cholesterol (Calc): 83 mg/dL (calc) (ref <130)
Total CHOL/HDL Ratio: 2.1 (calc) (ref <5.0)
Triglycerides: 36 mg/dL (ref <150)

## 2022-09-23 LAB — COMPLETE METABOLIC PANEL WITH GFR
AG Ratio: 1.5 (calc) (ref 1.0–2.5)
ALT: 10 U/L (ref 6–29)
AST: 12 U/L (ref 10–35)
Albumin: 4 g/dL (ref 3.6–5.1)
Alkaline phosphatase (APISO): 80 U/L (ref 37–153)
BUN: 14 mg/dL (ref 7–25)
CO2: 26 mmol/L (ref 20–32)
Calcium: 9 mg/dL (ref 8.6–10.4)
Chloride: 108 mmol/L (ref 98–110)
Creat: 0.65 mg/dL (ref 0.50–1.03)
Globulin: 2.6 g/dL (calc) (ref 1.9–3.7)
Glucose, Bld: 94 mg/dL (ref 65–99)
Potassium: 4.6 mmol/L (ref 3.5–5.3)
Sodium: 140 mmol/L (ref 135–146)
Total Bilirubin: 0.8 mg/dL (ref 0.2–1.2)
Total Protein: 6.6 g/dL (ref 6.1–8.1)
eGFR: 107 mL/min/{1.73_m2} (ref >=60)

## 2022-09-27 ENCOUNTER — Ambulatory Visit (INDEPENDENT_AMBULATORY_CARE_PROVIDER_SITE_OTHER): Payer: Managed Care, Other (non HMO) | Admitting: Family Medicine

## 2022-09-27 ENCOUNTER — Encounter: Payer: Self-pay | Admitting: Family Medicine

## 2022-09-27 VITALS — BP 122/82 | HR 73 | Temp 98.3°F | Ht 59.0 in | Wt 143.0 lb

## 2022-09-27 DIAGNOSIS — Z1211 Encounter for screening for malignant neoplasm of colon: Secondary | ICD-10-CM

## 2022-09-27 DIAGNOSIS — Z124 Encounter for screening for malignant neoplasm of cervix: Secondary | ICD-10-CM

## 2022-09-27 DIAGNOSIS — Z0000 Encounter for general adult medical examination without abnormal findings: Secondary | ICD-10-CM

## 2022-09-27 DIAGNOSIS — Z01419 Encounter for gynecological examination (general) (routine) without abnormal findings: Secondary | ICD-10-CM | POA: Diagnosis not present

## 2022-09-27 NOTE — Progress Notes (Signed)
Subjective:    Patient ID: Jeanette Herring, female    DOB: November 09, 1970, 52 y.o.   MRN: [id number]  HPI  Patient is a very sweet 52 year old Caucasian female who is here today for physical exam.  She is due for a colonoscopy to be done.  She would like me to schedule this for her.  She is due for a Pap smear.  Her mammogram is due in October.  Her most recent lab work is listed below Lab on 09/22/2022  Component Date Value Ref Range Status   WBC 09/22/2022 6.2  3.8 - 10.8 Thousand/uL Final   RBC 09/22/2022 4.26  3.80 - 5.10 Million/uL Final   Hemoglobin 09/22/2022 12.3  11.7 - 15.5 g/dL Final   HCT 78/29/5621 37.4  35.0 - 45.0 % Final   MCV 09/22/2022 87.8  80.0 - 100.0 fL Final   MCH 09/22/2022 28.9  27.0 - 33.0 pg Final   MCHC 09/22/2022 32.9  32.0 - 36.0 g/dL Final   RDW 30/86/5784 13.2  11.0 - 15.0 % Final   Platelets 09/22/2022 298  140 - 400 Thousand/uL Final   MPV 09/22/2022 11.1  7.5 - 12.5 fL Final   Neutro Abs 09/22/2022 3,863  1,500 - 7,800 cells/uL Final   Lymphs Abs 09/22/2022 1,550  850 - 3,900 cells/uL Final   Absolute Monocytes 09/22/2022 663  200 - 950 cells/uL Final   Eosinophils Absolute 09/22/2022 93  15 - 500 cells/uL Final   Basophils Absolute 09/22/2022 31  0 - 200 cells/uL Final   Neutrophils Relative % 09/22/2022 62.3  % Final   Total Lymphocyte 09/22/2022 25.0  % Final   Monocytes Relative 09/22/2022 10.7  % Final   Eosinophils Relative 09/22/2022 1.5  % Final   Basophils Relative 09/22/2022 0.5  % Final   Glucose, Bld 09/22/2022 94  65 - 99 mg/dL Final   Comment: .            Fasting reference interval .    BUN 09/22/2022 14  7 - 25 mg/dL Final   Creat 69/62/9528 0.65  0.50 - 1.03 mg/dL Final   eGFR 41/32/4401 107  > OR = 60 mL/min/1.76m2 Final   BUN/Creatinine Ratio 09/22/2022 SEE NOTE:  6 - 22 (calc) Final   Comment:    Not Reported: BUN and Creatinine are within    reference range. .    Sodium 09/22/2022 140  135 - 146 mmol/L Final   Potassium  09/22/2022 4.6  3.5 - 5.3 mmol/L Final   Chloride 09/22/2022 108  98 - 110 mmol/L Final   CO2 09/22/2022 26  20 - 32 mmol/L Final   Calcium 09/22/2022 9.0  8.6 - 10.4 mg/dL Final   Total Protein 02/72/5366 6.6  6.1 - 8.1 g/dL Final   Albumin 44/06/4740 4.0  3.6 - 5.1 g/dL Final   Globulin 59/56/3875 2.6  1.9 - 3.7 g/dL (calc) Final   AG Ratio 09/22/2022 1.5  1.0 - 2.5 (calc) Final   Total Bilirubin 09/22/2022 0.8  0.2 - 1.2 mg/dL Final   Alkaline phosphatase (APISO) 09/22/2022 80  37 - 153 U/L Final   AST 09/22/2022 12  10 - 35 U/L Final   ALT 09/22/2022 10  6 - 29 U/L Final   Cholesterol 09/22/2022 160  <200 mg/dL Final   HDL 64/33/2951 77  > OR = 50 mg/dL Final   Triglycerides 88/41/6606 36  <150 mg/dL Final   LDL Cholesterol (Calc) 09/22/2022 73  mg/dL (calc) Final  Comment: Reference range: <100 . Desirable range <100 mg/dL for primary prevention;   <70 mg/dL for patients with CHD or diabetic patients  with > or = 2 CHD risk factors. Marland Kitchen LDL-C is now calculated using the Martin-Hopkins  calculation, which is a validated novel method providing  better accuracy than the Friedewald equation in the  estimation of LDL-C.  Horald Pollen et al. Lenox Ahr. 1610;960(45): 2061-2068  (http://education.[facility].com/faq/FAQ164)    Total CHOL/HDL Ratio 09/22/2022 2.1  <4.0 (calc) Final   Non-HDL Cholesterol (Calc) 09/22/2022 83  <130 mg/dL (calc) Final   Comment: For patients with diabetes plus 1 major ASCVD risk  factor, treating to a non-HDL-C goal of <100 mg/dL  (LDL-C of <98 mg/dL) is considered a therapeutic  option.    Lab work is outstanding.  Patient is battling hot flashes due to menopause.  Her menstrual cycles have ceased.  We spent a fair amount of time today discussing hormone replacement therapy.  The patient still has her uterus so she would have to be on estrogen and progesterone therapy.  We discussed potential risk of breast cancer, DVTs, and stroke.  Also explained that  the risk is relatively low in women under the age of 52.  She would like to research this.  She has tried black cohosh with minimal benefit Past Medical History:  Diagnosis Date   Cough 07/14/2013   Headache(784.0)    sinus   Lipoma of shoulder 06/2013   right   Past Surgical History:  Procedure Laterality Date   LIPOMA EXCISION Right 07/17/2013   Procedure: EXCISION RIGHT SHOULDER LIPOMA;  Surgeon: Emelia Loron, MD;  Location: [facility];  Service: General;  Laterality: Right;   NO PAST SURGERIES     Current Outpatient Medications on File Prior to Visit  Medication Sig Dispense Refill   levocetirizine (XYZAL ALLERGY 24HR) 5 MG tablet Take 1 tablet (5 mg total) by mouth every evening. (Patient not taking: Reported on 09/27/2022) 30 tablet 3   No current facility-administered medications on file prior to visit.   No Known Allergies Social History   Socioeconomic History   Marital status: Married    Spouse name: Not on file   Number of children: Not on file   Years of education: Not on file   Highest education level: Not on file  Occupational History   Not on file  Tobacco Use   Smoking status: Former   Smokeless tobacco: Never   Tobacco comments:    quit smoking 4 years ago  Substance and Sexual Activity   Alcohol use: Yes    Comment: occasionally   Drug use: No   Sexual activity: Yes  Other Topics Concern   Not on file  Social History Narrative   Not on file   Social Determinants of Health   Financial Resource Strain: Not on file  Food Insecurity: Not on file  Transportation Needs: Not on file  Physical Activity: Not on file  Stress: Not on file  Social Connections: Not on file  Intimate Partner Violence: Not on file   No family history on file.    Review of Systems  All other systems reviewed and are negative.      Objective:   Physical Exam Vitals reviewed. Exam conducted with a chaperone present.  Constitutional:      General:  She is not in acute distress.    Appearance: She is well-developed. She is not diaphoretic.  HENT:     Head: Normocephalic and  atraumatic.     Right Ear: External ear normal.     Left Ear: External ear normal.     Nose: Nose normal.     Mouth/Throat:     Pharynx: No oropharyngeal exudate.  Eyes:     General: No scleral icterus.       Right eye: No discharge.        Left eye: No discharge.     Conjunctiva/sclera: Conjunctivae normal.     Pupils: Pupils are equal, round, and reactive to light.  Neck:     Thyroid: No thyromegaly.     Vascular: No JVD.     Trachea: No tracheal deviation.  Cardiovascular:     Rate and Rhythm: Normal rate and regular rhythm.     Heart sounds: Normal heart sounds. No murmur heard.    No friction rub. No gallop.  Pulmonary:     Effort: Pulmonary effort is normal. No respiratory distress.     Breath sounds: Normal breath sounds. No stridor. No wheezing or rales.  Chest:     Chest wall: No tenderness.  Abdominal:     General: Bowel sounds are normal. There is no distension.     Palpations: Abdomen is soft.     Tenderness: There is no abdominal tenderness. There is no guarding or rebound.     Hernia: There is no hernia in the left inguinal area or right inguinal area.  Genitourinary:    General: Normal vulva.     Labia:        Right: No rash.        Left: No rash.      Vagina: Normal. No vaginal discharge.     Cervix: Normal.     Uterus: Normal.      Adnexa: Right adnexa normal and left adnexa normal.       Right: No mass or tenderness.         Left: No mass or tenderness.    Musculoskeletal:        General: No tenderness. Normal range of motion.     Cervical back: Normal range of motion and neck supple.  Lymphadenopathy:     Cervical: No cervical adenopathy.     Lower Body: No right inguinal adenopathy. No left inguinal adenopathy.  Skin:    General: Skin is warm.     Coloration: Skin is not pale.     Findings: No erythema or rash.   Neurological:     Mental Status: She is alert and oriented to person, place, and time.     Cranial Nerves: No cranial nerve deficit.     Motor: No abnormal muscle tone.     Coordination: Coordination normal.     Deep Tendon Reflexes: Reflexes are normal and symmetric.  Psychiatric:        Behavior: Behavior normal.        Thought Content: Thought content normal.        Judgment: Judgment normal.           Assessment & Plan:  Cervical cancer screening - Plan: PAP, Thin Prep w/HPV rflx HPV Type 16/18  General medical exam Physical exam today is completely normal.  Pap smear was sent to pathology in a labeled container.  Mammogram is due in October.  I will schedule the patient for colonoscopy.  Lab work was outstanding.  Regular anticipatory guidance is provided.

## 2022-09-28 ENCOUNTER — Encounter: Payer: Managed Care, Other (non HMO) | Admitting: Family Medicine

## 2022-10-05 LAB — THINPREP IMAGING PAP AND HPV DNA REFLEX HPV 16, 18

## 2023-03-05 LAB — HM MAMMOGRAPHY

## 2023-05-07 ENCOUNTER — Encounter: Payer: Self-pay | Admitting: Family Medicine

## 2023-05-07 ENCOUNTER — Ambulatory Visit (INDEPENDENT_AMBULATORY_CARE_PROVIDER_SITE_OTHER): Payer: PRIVATE HEALTH INSURANCE | Admitting: Family Medicine

## 2023-05-07 VITALS — BP 124/80 | HR 69 | Temp 99.0°F | Ht 59.0 in | Wt 144.1 lb

## 2023-05-07 DIAGNOSIS — B9689 Other specified bacterial agents as the cause of diseases classified elsewhere: Secondary | ICD-10-CM

## 2023-05-07 DIAGNOSIS — J019 Acute sinusitis, unspecified: Secondary | ICD-10-CM

## 2023-05-07 MED ORDER — AMOXICILLIN 875 MG PO TABS: 875.0000 mg | ORAL_TABLET | Freq: Two times a day (BID) | ORAL | 0 refills | Status: AC

## 2023-05-07 MED ORDER — FLUCONAZOLE 150 MG PO TABS: 150.0000 mg | ORAL_TABLET | Freq: Once | ORAL | 0 refills | Status: AC

## 2023-05-07 NOTE — Progress Notes (Signed)
 Subjective:    Patient ID: Jeanette Herring, female    DOB: June 23, 1970, 53 y.o.   MRN: [id number]  HPI Patient is a very pleasant 53 year old Caucasian female who presents today with 3-week history of sinus pain in her frontal and maxillary sinus on the left-hand side.  Symptoms began with what sounds like a viral upper respiratory infection.  She had runny nose, congestion, postnasal drip, and cough.  This all gradually improved however for the last 2 weeks she has been dealing with a pressure-like headache around her left eye and in her left cheek.  She does not have any more cough or sore throat.  She does have a constant dull headache and sinus pressure. Past Medical History:  Diagnosis Date   Cough 07/14/2013   Headache(784.0)    sinus   Lipoma of shoulder 06/2013   right   Past Surgical History:  Procedure Laterality Date   LIPOMA EXCISION Right 07/17/2013   Procedure: EXCISION RIGHT SHOULDER LIPOMA;  Surgeon: Donnice Bury, MD;  Location: [facility];  Service: General;  Laterality: Right;   NO PAST SURGERIES     Current Outpatient Medications on File Prior to Visit  Medication Sig Dispense Refill   levocetirizine (XYZAL  ALLERGY 24HR) 5 MG tablet Take 1 tablet (5 mg total) by mouth every evening. (Patient not taking: Reported on 09/27/2022) 30 tablet 3   No current facility-administered medications on file prior to visit.   No Known Allergies Social History   Socioeconomic History   Marital status: Married    Spouse name: Not on file   Number of children: Not on file   Years of education: Not on file   Highest education level: Not on file  Occupational History   Not on file  Tobacco Use   Smoking status: Former   Smokeless tobacco: Never   Tobacco comments:    quit smoking 4 years ago  Substance and Sexual Activity   Alcohol use: Yes    Comment: occasionally   Drug use: No   Sexual activity: Yes  Other Topics Concern   Not on file  Social  History Narrative   Not on file   Social Drivers of Health   Financial Resource Strain: Not on file  Food Insecurity: Not on file  Transportation Needs: Not on file  Physical Activity: Not on file  Stress: Not on file  Social Connections: Not on file  Intimate Partner Violence: Not on file     Review of Systems  All other systems reviewed and are negative.      Objective:   Physical Exam Vitals reviewed.  Constitutional:      Appearance: Normal appearance. She is normal weight.  HENT:     Right Ear: Tympanic membrane and ear canal normal.     Left Ear: Tympanic membrane and ear canal normal.     Nose: Congestion and rhinorrhea present.     Mouth/Throat:     Pharynx: No oropharyngeal exudate or posterior oropharyngeal erythema.  Eyes:     Conjunctiva/sclera: Conjunctivae normal.     Pupils: Pupils are equal, round, and reactive to light.  Cardiovascular:     Rate and Rhythm: Normal rate and regular rhythm.     Heart sounds: Normal heart sounds. No murmur heard.    No friction rub. No gallop.  Pulmonary:     Effort: Pulmonary effort is normal. No respiratory distress.     Breath sounds: Normal breath sounds. No wheezing, rhonchi or  rales.  Neurological:     Mental Status: She is alert.         Assessment & Plan:  .Acute bacterial rhinosinusitis I believe the patient developed a secondary sinus infection.  Okay to amoxicillin  805 g in 7 days.  Did give her prescription for Diflucan  in case she develops a secondary yeast infection.  She did ask me about her husband.  Her husband, Larnell, has been having the same symptoms for 3 weeks.  His to his now progressed to a headache.  His headache is located in his frontal sinuses bilaterally.  He has been taking Mucinex and other cold medication for the last 3 weeks without any benefit.  She is requesting amoxicillin  for him as well.  He tried to go to an urgent care but they did not take walk-ins.  I explained to her that I would  like to see him however I will write a prescription for amoxicillin  875 mg twice daily for 7 days until he can get an appointment.

## 2023-10-05 ENCOUNTER — Ambulatory Visit (INDEPENDENT_AMBULATORY_CARE_PROVIDER_SITE_OTHER): Payer: PRIVATE HEALTH INSURANCE | Admitting: Family Medicine

## 2023-10-05 ENCOUNTER — Encounter: Payer: Self-pay | Admitting: Family Medicine

## 2023-10-05 VITALS — BP 124/76 | HR 88 | Temp 98.3°F | Ht 59.0 in | Wt 151.0 lb

## 2023-10-05 DIAGNOSIS — Z0001 Encounter for general adult medical examination with abnormal findings: Secondary | ICD-10-CM | POA: Diagnosis not present

## 2023-10-05 DIAGNOSIS — Z13220 Encounter for screening for lipoid disorders: Secondary | ICD-10-CM | POA: Diagnosis not present

## 2023-10-05 DIAGNOSIS — Z1211 Encounter for screening for malignant neoplasm of colon: Secondary | ICD-10-CM

## 2023-10-05 DIAGNOSIS — R5383 Other fatigue: Secondary | ICD-10-CM | POA: Diagnosis not present

## 2023-10-05 DIAGNOSIS — Z0000 Encounter for general adult medical examination without abnormal findings: Secondary | ICD-10-CM

## 2023-10-05 NOTE — Progress Notes (Signed)
 Subjective:    Patient ID: Jeanette Herring, female    DOB: 04/09/1971, 53 y.o.   MRN: [id number]  HPI  Patient is a very sweet 53 year old Caucasian female who is here today for physical exam.  Patient had a Pap smear in May 2024 which showed no evidence of any atypical cells and also was negative for high risk HPV.  Therefore her Pap smear is up-to-date.  Patient had a normal mammogram in November 2024.  Therefore this is up-to-date.  She appears to be overdue for a colonoscopy.  She continues to deal with hot flashes.  She also is having a difficult time losing weight after starting menopause.  She has been trying a low-carb diet along with aerobic exercise but her weight loss has plateaued. Past Medical History:  Diagnosis Date   Cough 07/14/2013   Headache(784.0)    sinus   Lipoma of shoulder 06/2013   right   Past Surgical History:  Procedure Laterality Date   LIPOMA EXCISION Right 07/17/2013   Procedure: EXCISION RIGHT SHOULDER LIPOMA;  Surgeon: Enid Harry, MD;  Location: [facility];  Service: General;  Laterality: Right;   NO PAST SURGERIES     Current Outpatient Medications on File Prior to Visit  Medication Sig Dispense Refill   levocetirizine (XYZAL  ALLERGY 24HR) 5 MG tablet Take 1 tablet (5 mg total) by mouth every evening. (Patient not taking: Reported on 05/07/2023) 30 tablet 3   No current facility-administered medications on file prior to visit.   No Known Allergies Social History   Socioeconomic History   Marital status: Married    Spouse name: Not on file   Number of children: Not on file   Years of education: Not on file   Highest education level: Not on file  Occupational History   Not on file  Tobacco Use   Smoking status: Former   Smokeless tobacco: Never   Tobacco comments:    quit smoking 4 years ago  Substance and Sexual Activity   Alcohol use: Yes    Comment: occasionally   Drug use: No   Sexual activity: Yes  Other Topics  Concern   Not on file  Social History Narrative   Not on file   Social Drivers of Health   Financial Resource Strain: Not on file  Food Insecurity: Not on file  Transportation Needs: Not on file  Physical Activity: Not on file  Stress: Not on file  Social Connections: Not on file  Intimate Partner Violence: Not on file   No family history on file.    Review of Systems  All other systems reviewed and are negative.      Objective:   Physical Exam Vitals reviewed.  Constitutional:      General: She is not in acute distress.    Appearance: She is well-developed. She is not diaphoretic.  HENT:     Head: Normocephalic and atraumatic.     Right Ear: External ear normal.     Left Ear: External ear normal.     Nose: Nose normal.     Mouth/Throat:     Pharynx: No oropharyngeal exudate.  Eyes:     General: No scleral icterus.       Right eye: No discharge.        Left eye: No discharge.     Conjunctiva/sclera: Conjunctivae normal.     Pupils: Pupils are equal, round, and reactive to light.  Neck:     Thyroid: No  thyromegaly.     Vascular: No JVD.     Trachea: No tracheal deviation.  Cardiovascular:     Rate and Rhythm: Normal rate and regular rhythm.     Heart sounds: Normal heart sounds. No murmur heard.    No friction rub. No gallop.  Pulmonary:     Effort: Pulmonary effort is normal. No respiratory distress.     Breath sounds: Normal breath sounds. No stridor. No wheezing or rales.  Chest:     Chest wall: No tenderness.  Abdominal:     General: Bowel sounds are normal. There is no distension.     Palpations: Abdomen is soft.     Tenderness: There is no abdominal tenderness. There is no guarding or rebound.  Genitourinary:    Vagina: Normal. No vaginal discharge.     Cervix: Normal.     Uterus: Normal.      Adnexa: Right adnexa normal and left adnexa normal.       Right: No mass or tenderness.         Left: No mass or tenderness.    Musculoskeletal:         General: No tenderness. Normal range of motion.     Cervical back: Normal range of motion and neck supple.  Lymphadenopathy:     Cervical: No cervical adenopathy.  Skin:    General: Skin is warm.     Coloration: Skin is not pale.     Findings: No erythema or rash.  Neurological:     Mental Status: She is alert and oriented to person, place, and time.     Cranial Nerves: No cranial nerve deficit.     Motor: No abnormal muscle tone.     Coordination: Coordination normal.     Deep Tendon Reflexes: Reflexes are normal and symmetric.  Psychiatric:        Behavior: Behavior normal.        Thought Content: Thought content normal.        Judgment: Judgment normal.           Assessment & Plan:  General medical exam - Plan: CBC with Differential/Platelet, Comprehensive metabolic panel with GFR, Lipid panel, TSH  Colon cancer screening - Plan: Cologuard  Fatigue, unspecified type - Plan: TSH  Screening cholesterol level - Plan: Lipid panel Patient's physical exam today is normal.  Given the change in her weight, I will check a TSH to rule out hypothyroidism.  I suspect a lot of this is due to hormone imbalances in the body.  We discussed hormone replacement therapy including the risk of breast cancer at the present time she is not interested in trying that.  We also discussed Veozah.  Patient will let me know if she wants to try that medication for hot flashes.  At the present time she is doing black cohosh and that seems to be working well.  We also discussed Zepbound for weight loss.  She can check with her insurance to see if it is covered.  I will schedule the patient for Cologuard.  Pap smear is not due again until 2027.  Mammogram is up-to-date.

## 2023-10-06 LAB — LIPID PANEL
Cholesterol: 166 mg/dL (ref <200)
HDL: 84 mg/dL (ref >=50)
LDL Cholesterol (Calc): 69 mg/dL
Non-HDL Cholesterol (Calc): 82 mg/dL (ref <130)
Total CHOL/HDL Ratio: 2 (calc) (ref <5.0)
Triglycerides: 52 mg/dL (ref <150)

## 2023-10-06 LAB — COMPREHENSIVE METABOLIC PANEL WITH GFR
AG Ratio: 1.7 (calc) (ref 1.0–2.5)
ALT: 11 U/L (ref 6–29)
AST: 14 U/L (ref 10–35)
Albumin: 4.2 g/dL (ref 3.6–5.1)
Alkaline phosphatase (APISO): 81 U/L (ref 37–153)
BUN: 13 mg/dL (ref 7–25)
CO2: 24 mmol/L (ref 20–32)
Calcium: 9.2 mg/dL (ref 8.6–10.4)
Chloride: 108 mmol/L (ref 98–110)
Creat: 0.7 mg/dL (ref 0.50–1.03)
Globulin: 2.5 g/dL (ref 1.9–3.7)
Glucose, Bld: 91 mg/dL (ref 65–99)
Potassium: 4.8 mmol/L (ref 3.5–5.3)
Sodium: 141 mmol/L (ref 135–146)
Total Bilirubin: 0.8 mg/dL (ref 0.2–1.2)
Total Protein: 6.7 g/dL (ref 6.1–8.1)
eGFR: 104 mL/min/{1.73_m2} (ref >=60)

## 2023-10-06 LAB — CBC WITH DIFFERENTIAL/PLATELET
Absolute Lymphocytes: 1912 {cells}/uL (ref 850–3900)
Absolute Monocytes: 656 {cells}/uL (ref 200–950)
Basophils Absolute: 32 {cells}/uL (ref 0–200)
Basophils Relative: 0.4 %
Eosinophils Absolute: 89 {cells}/uL (ref 15–500)
Eosinophils Relative: 1.1 %
HCT: 38.6 % (ref 35.0–45.0)
Hemoglobin: 12.3 g/dL (ref 11.7–15.5)
MCH: 28.7 pg (ref 27.0–33.0)
MCHC: 31.9 g/dL — ABNORMAL LOW (ref 32.0–36.0)
MCV: 90 fL (ref 80.0–100.0)
MPV: 11.2 fL (ref 7.5–12.5)
Monocytes Relative: 8.1 %
Neutro Abs: 5411 {cells}/uL (ref 1500–7800)
Neutrophils Relative %: 66.8 %
Platelets: 307 10*3/uL (ref 140–400)
RBC: 4.29 10*6/uL (ref 3.80–5.10)
RDW: 13.9 % (ref 11.0–15.0)
Total Lymphocyte: 23.6 %
WBC: 8.1 10*3/uL (ref 3.8–10.8)

## 2023-10-06 LAB — TSH: TSH: 0.81 m[IU]/L

## 2023-10-08 ENCOUNTER — Ambulatory Visit: Payer: Self-pay | Admitting: Family Medicine

## 2023-10-23 LAB — COLOGUARD: COLOGUARD: NEGATIVE

## 2024-03-05 LAB — HM MAMMOGRAPHY

## 2024-03-06 ENCOUNTER — Encounter: Payer: Self-pay | Admitting: Family Medicine

## 2024-10-06 ENCOUNTER — Encounter: Payer: PRIVATE HEALTH INSURANCE | Admitting: Family Medicine
